# Patient Record
Sex: Male | Born: 1965 | Race: White | Hispanic: No | Marital: Married | Smoking: Former smoker
Health system: Southern US, Community
[De-identification: ages and names within clinical notes are randomized; demographics above are authoritative.]

## PROBLEM LIST (undated history)

## (undated) DIAGNOSIS — T7840XA Allergy, unspecified, initial encounter: Secondary | ICD-10-CM

## (undated) DIAGNOSIS — G4730 Sleep apnea, unspecified: Secondary | ICD-10-CM

## (undated) DIAGNOSIS — M1990 Unspecified osteoarthritis, unspecified site: Secondary | ICD-10-CM

## (undated) DIAGNOSIS — G4733 Obstructive sleep apnea (adult) (pediatric): Principal | ICD-10-CM

## (undated) DIAGNOSIS — F419 Anxiety disorder, unspecified: Secondary | ICD-10-CM

## (undated) DIAGNOSIS — E785 Hyperlipidemia, unspecified: Secondary | ICD-10-CM

## (undated) DIAGNOSIS — F429 Obsessive-compulsive disorder, unspecified: Secondary | ICD-10-CM

## (undated) DIAGNOSIS — R011 Cardiac murmur, unspecified: Secondary | ICD-10-CM

## (undated) DIAGNOSIS — I10 Essential (primary) hypertension: Secondary | ICD-10-CM

## (undated) HISTORY — DX: Allergy, unspecified, initial encounter: T78.40XA

## (undated) HISTORY — DX: Cardiac murmur, unspecified: R01.1

## (undated) HISTORY — DX: Hyperlipidemia, unspecified: E78.5

## (undated) HISTORY — DX: Essential (primary) hypertension: I10

## (undated) HISTORY — DX: Obstructive sleep apnea (adult) (pediatric): G47.33

## (undated) HISTORY — DX: Unspecified osteoarthritis, unspecified site: M19.90

## (undated) HISTORY — DX: Sleep apnea, unspecified: G47.30

## (undated) HISTORY — PX: VASECTOMY: SHX75

## (undated) HISTORY — DX: Obsessive-compulsive disorder, unspecified: F42.9

## (undated) HISTORY — DX: Anxiety disorder, unspecified: F41.9

## (undated) HISTORY — PX: POLYPECTOMY: SHX149

---

## 2000-05-14 ENCOUNTER — Emergency Department (HOSPITAL_COMMUNITY): Admission: EM | Admit: 2000-05-14 | Discharge: 2000-05-14 | Payer: Self-pay | Admitting: Internal Medicine

## 2010-04-11 HISTORY — PX: OTHER SURGICAL HISTORY: SHX169

## 2010-09-10 HISTORY — PX: CARPAL TUNNEL RELEASE: SHX101

## 2011-04-18 ENCOUNTER — Encounter: Payer: Self-pay | Admitting: Internal Medicine

## 2011-06-29 ENCOUNTER — Ambulatory Visit (INDEPENDENT_AMBULATORY_CARE_PROVIDER_SITE_OTHER): Payer: Federal, State, Local not specified - PPO | Admitting: Physician Assistant

## 2011-06-29 VITALS — BP 143/89 | HR 60 | Temp 98.4°F | Resp 18 | Ht 66.0 in | Wt 190.0 lb

## 2011-06-29 DIAGNOSIS — I10 Essential (primary) hypertension: Secondary | ICD-10-CM

## 2011-06-29 DIAGNOSIS — F429 Obsessive-compulsive disorder, unspecified: Secondary | ICD-10-CM

## 2011-06-29 DIAGNOSIS — E785 Hyperlipidemia, unspecified: Secondary | ICD-10-CM

## 2011-06-29 LAB — COMPREHENSIVE METABOLIC PANEL
ALT: 35 U/L (ref 0–35)
Albumin: 4.7 g/dL (ref 3.5–5.2)
Alkaline Phosphatase: 45 U/L (ref 39–117)
Glucose, Bld: 94 mg/dL (ref 70–99)
Potassium: 4.5 mEq/L (ref 3.5–5.3)
Sodium: 140 mEq/L (ref 135–145)
Total Bilirubin: 0.7 mg/dL (ref 0.3–1.2)
Total Protein: 6.9 g/dL (ref 6.0–8.3)

## 2011-06-29 LAB — LIPID PANEL
LDL Cholesterol: 115 mg/dL — ABNORMAL HIGH (ref 0–99)
Total CHOL/HDL Ratio: 4.3 Ratio

## 2011-06-29 MED ORDER — ROSUVASTATIN CALCIUM 20 MG PO TABS: 20.0000 mg | ORAL_TABLET | Freq: Every day | ORAL | Status: DC

## 2011-06-29 MED ORDER — PAROXETINE HCL 30 MG PO TABS: 30.0000 mg | ORAL_TABLET | ORAL | Status: DC

## 2011-06-29 MED ORDER — LISINOPRIL-HYDROCHLOROTHIAZIDE 10-12.5 MG PO TABS: 1.0000 | ORAL_TABLET | Freq: Every day | ORAL | Status: DC

## 2011-06-29 NOTE — Patient Instructions (Signed)
Work on healthy eating and regular exercise. 

## 2011-06-29 NOTE — Progress Notes (Addendum)
  Subjective:    Patient ID: Paul Carson, male    DOB: 05/29/1965, 46 y.o.   MRN: [id number]  HPI Presents for medication refills and re-evaluation of his chronic medical problems:  HTN, hyperlipidemia, and depression.  Feels good on his current regimen.  No complaints.   Review of Systems No chest pain, SOB, HA, dizziness, vision change, N/V, diarrhea, dysuria, myalgias, arthralgias or rash.     Objective:   Physical Exam  Vital signs noted. Well-developed, well nourished WM who is awake, alert and oriented, in NAD. HEENT: [place]/AT, PERRL, EOMI.  Sclera and conjunctiva are clear.  EAC are patent, TMs are normal in appearance. Nasal mucosa is pink and moist. OP is clear. Neck: supple, non-tender, no lymphadenopathey, thyromegaly. Heart: RRR, no murmur Lungs: CTA Abdomen: normo-active bowel sounds, supple, non-tender, no mass or organomegaly. Extremities: no cyanosis, clubbing or edema. Skin: warm and dry without rash.  CMET, Lipids are pending.     Assessment & Plan:   1. Unspecified essential hypertension  lisinopril-hydrochlorothiazide (PRINZIDE,ZESTORETIC) 10-12.5 MG per tablet, Comprehensive metabolic panel  2. Other and unspecified hyperlipidemia  rosuvastatin (CRESTOR) 20 MG tablet, Lipid panel, Comprehensive metabolic panel  3. OCD (obsessive compulsive disorder)  PARoxetine (PAXIL) 30 MG tablet   Schedule CPE with Dr. Perrin Maltese in 6 months.

## 2011-06-30 ENCOUNTER — Encounter: Payer: Self-pay | Admitting: Physician Assistant

## 2011-12-19 ENCOUNTER — Other Ambulatory Visit: Payer: Self-pay | Admitting: Physician Assistant

## 2012-01-02 ENCOUNTER — Encounter: Payer: Self-pay | Admitting: Physician Assistant

## 2012-01-02 ENCOUNTER — Ambulatory Visit (INDEPENDENT_AMBULATORY_CARE_PROVIDER_SITE_OTHER): Payer: Federal, State, Local not specified - PPO | Admitting: Physician Assistant

## 2012-01-02 ENCOUNTER — Encounter: Payer: Federal, State, Local not specified - PPO | Admitting: Internal Medicine

## 2012-01-02 VITALS — BP 126/86 | HR 81 | Temp 98.3°F | Resp 16 | Ht 67.0 in | Wt 186.0 lb

## 2012-01-02 DIAGNOSIS — E785 Hyperlipidemia, unspecified: Secondary | ICD-10-CM

## 2012-01-02 DIAGNOSIS — F429 Obsessive-compulsive disorder, unspecified: Secondary | ICD-10-CM

## 2012-01-02 DIAGNOSIS — I10 Essential (primary) hypertension: Secondary | ICD-10-CM

## 2012-01-02 MED ORDER — PAROXETINE HCL 30 MG PO TABS: 30.0000 mg | ORAL_TABLET | ORAL | Status: DC

## 2012-01-02 MED ORDER — LISINOPRIL-HYDROCHLOROTHIAZIDE 10-12.5 MG PO TABS: 1.0000 | ORAL_TABLET | Freq: Every day | ORAL | Status: DC

## 2012-01-02 MED ORDER — ROSUVASTATIN CALCIUM 20 MG PO TABS: 20.0000 mg | ORAL_TABLET | Freq: Every day | ORAL | Status: DC

## 2012-01-02 NOTE — Progress Notes (Signed)
  Subjective:    Patient ID: Paul Carson, male    DOB: 09-May-1965, 46 y.o.   MRN: [id number]  HPI 46 year old male presents for follow up of hypertension, hyperlipidemia, and depression. He is stable on all medications and denies any adverse effects.  Admits that he has changed to eating [employer]uss most days for lunch. Denies any aerobic exercise.  He has been taking his Crestor daily.  No other complaints today.     Review of Systems  All other systems reviewed and are negative.       Objective:   Physical Exam  Constitutional: He is oriented to person, place, and time. He appears well-developed and well-nourished.  HENT:  Head: Normocephalic and atraumatic.  Right Ear: External ear normal.  Eyes: Conjunctivae normal are normal.  Neck: Normal range of motion.  Cardiovascular: Normal rate, regular rhythm and normal heart sounds.   Pulmonary/Chest: Effort normal and breath sounds normal.  Neurological: He is alert and oriented to person, place, and time.  Psychiatric: He has a normal mood and affect. His behavior is normal. Judgment and thought content normal.          Assessment & Plan:   1. Hyperlipidemia  Lipid panel, rosuvastatin (CRESTOR) 20 MG tablet  2. Hypertension  Comprehensive metabolic panel  3. Unspecified essential hypertension  lisinopril-hydrochlorothiazide (PRINZIDE,ZESTORETIC) 10-12.5 MG per tablet  4. OCD (obsessive compulsive disorder)  PARoxetine (PAXIL) 30 MG tablet   Continue current treatment regimen.  Will await results of lipids.  If still elevated consider adding another medication.  Follow up in 6 months for CPE.

## 2012-01-03 LAB — COMPREHENSIVE METABOLIC PANEL
ALT: 32 U/L (ref 0–53)
AST: 21 U/L (ref 0–37)
Albumin: 4.8 g/dL (ref 3.5–5.2)
Alkaline Phosphatase: 40 U/L (ref 39–117)
BUN: 13 mg/dL (ref 6–23)
CO2: 26 mEq/L (ref 19–32)
Calcium: 9.5 mg/dL (ref 8.4–10.5)
Chloride: 104 mEq/L (ref 96–112)
Creat: 0.96 mg/dL (ref 0.50–1.35)
Glucose, Bld: 93 mg/dL (ref 70–99)
Potassium: 3.7 mEq/L (ref 3.5–5.3)
Sodium: 139 mEq/L (ref 135–145)
Total Bilirubin: 0.5 mg/dL (ref 0.3–1.2)
Total Protein: 7.1 g/dL (ref 6.0–8.3)

## 2012-01-03 LAB — LIPID PANEL
Cholesterol: 185 mg/dL (ref 0–200)
HDL: 42 mg/dL (ref >39)
LDL Cholesterol: 74 mg/dL (ref 0–99)
Total CHOL/HDL Ratio: 4.4 Ratio
Triglycerides: 346 mg/dL — ABNORMAL HIGH (ref <150)
VLDL: 69 mg/dL — ABNORMAL HIGH (ref 0–40)

## 2012-03-27 ENCOUNTER — Ambulatory Visit (INDEPENDENT_AMBULATORY_CARE_PROVIDER_SITE_OTHER): Payer: Federal, State, Local not specified - PPO | Admitting: Internal Medicine

## 2012-03-27 ENCOUNTER — Ambulatory Visit: Payer: Federal, State, Local not specified - PPO

## 2012-03-27 VITALS — BP 140/90 | HR 81 | Temp 98.1°F | Resp 16 | Ht 66.0 in | Wt 187.0 lb

## 2012-03-27 DIAGNOSIS — M25572 Pain in left ankle and joints of left foot: Secondary | ICD-10-CM

## 2012-03-27 DIAGNOSIS — E785 Hyperlipidemia, unspecified: Secondary | ICD-10-CM

## 2012-03-27 DIAGNOSIS — M109 Gout, unspecified: Secondary | ICD-10-CM

## 2012-03-27 DIAGNOSIS — I10 Essential (primary) hypertension: Secondary | ICD-10-CM

## 2012-03-27 DIAGNOSIS — E789 Disorder of lipoprotein metabolism, unspecified: Secondary | ICD-10-CM

## 2012-03-27 DIAGNOSIS — M25579 Pain in unspecified ankle and joints of unspecified foot: Secondary | ICD-10-CM

## 2012-03-27 LAB — COMPREHENSIVE METABOLIC PANEL
Albumin: 4.5 g/dL (ref 3.5–5.2)
Alkaline Phosphatase: 47 U/L (ref 39–117)
BUN: 8 mg/dL (ref 6–23)
CO2: 26 mEq/L (ref 19–32)
Glucose, Bld: 94 mg/dL (ref 70–99)
Total Bilirubin: 0.8 mg/dL (ref 0.3–1.2)

## 2012-03-27 LAB — POCT CBC
HCT, POC: 51.3 % (ref 43.5–53.7)
Lymph, poc: 1.6 (ref 0.6–3.4)
MCH, POC: 29.5 pg (ref 27–31.2)
MCV: 92.2 fL (ref 80–97)
MID (cbc): 0.8 (ref 0–0.9)
POC LYMPH PERCENT: 15.1 %L (ref 10–50)
Platelet Count, POC: 425 10*3/uL — AB (ref 142–424)
RDW, POC: 13.3 %
WBC: 10.8 10*3/uL — AB (ref 4.6–10.2)

## 2012-03-27 LAB — URIC ACID: Uric Acid, Serum: 10.1 mg/dL — ABNORMAL HIGH (ref 4.0–7.8)

## 2012-03-27 LAB — LIPID PANEL
Cholesterol: 165 mg/dL (ref 0–200)
Triglycerides: 149 mg/dL (ref <150)
VLDL: 30 mg/dL (ref 0–40)

## 2012-03-27 MED ORDER — LISINOPRIL 5 MG PO TABS: 5.0000 mg | ORAL_TABLET | Freq: Every day | ORAL | Status: DC

## 2012-03-27 MED ORDER — HYDROCODONE-ACETAMINOPHEN 5-325 MG PO TABS: 1.0000 | ORAL_TABLET | Freq: Four times a day (QID) | ORAL | Status: DC | PRN

## 2012-03-27 MED ORDER — IBUPROFEN 800 MG PO TABS: 800.0000 mg | ORAL_TABLET | Freq: Three times a day (TID) | ORAL | Status: DC | PRN

## 2012-03-27 NOTE — Progress Notes (Signed)
  Subjective:    Patient ID: Paul Carson, male    DOB: [birth year], 46 y.o.   MRN: [id number]  HPI Has ankle pain with no injury for 4-5d. Painful at rest and with passive or active rom. No reddness but some warmth Is on hctz for bp   Review of Systems fhx of gout    Objective:   Physical Exam  Vitals reviewed. Constitutional: He appears well-developed and well-nourished.  Eyes: EOM are normal.  Neck: Neck supple.  Cardiovascular: Normal rate and normal heart sounds.   Pulmonary/Chest: Effort normal.  Musculoskeletal:       Left ankle: He exhibits decreased range of motion and swelling. tenderness. Medial malleolus tenderness found. Achilles tendon exhibits pain. Achilles tendon exhibits normal Thompson's test results.       Feet:   [facility] reading (PRIMARY) by  Dr.Guest has warm very tender medial ankle/NAD seen  Results for orders placed in visit on 03/27/12  POCT CBC      Component Value Range   WBC 10.8 (*) 4.6 - 10.2 K/uL   Lymph, poc 1.6  0.6 - 3.4   POC LYMPH PERCENT 15.1  10 - 50 %L   MID (cbc) 0.8  0 - 0.9   POC MID % 7.4  0 - 12 %M   POC Granulocyte 8.4 (*) 2 - 6.9   Granulocyte percent 77.5  37 - 80 %G   RBC 5.56  4.69 - 6.13 M/uL   Hemoglobin 16.4  14.1 - 18.1 g/dL   HCT, POC 56.2  13.0 - 53.7 %   MCV 92.2  80 - 97 fL   MCH, POC 29.5  27 - 31.2 pg   MCHC 32.0  31.8 - 35.4 g/dL   RDW, POC 86.5     Platelet Count, POC 425 (*) 142 - 424 K/uL   MPV 8.8  0 - 99.8 fL          Assessment & Plan:  DC hctz Start lisinopril 5mg  qd Motrin 800mg  tid pc till pain gone Rest off feet

## 2012-03-27 NOTE — Patient Instructions (Signed)
Gout Gout is an inflammatory condition (arthritis) caused by a buildup of uric acid crystals in the joints. Uric acid is a chemical that is normally present in the blood. Under some circumstances, uric acid can form into crystals in your joints. This causes joint redness, soreness, and swelling (inflammation). Repeat attacks are common. Over time, uric acid crystals can form into masses (tophi) near a joint, causing disfigurement. Gout is treatable and often preventable. CAUSES  The disease begins with elevated levels of uric acid in the blood. Uric acid is produced by your body when it breaks down a naturally found substance called purines. This also happens when you eat certain foods such as meats and fish. Causes of an elevated uric acid level include:  Being passed down from parent to child (heredity).  Diseases that cause increased uric acid production (obesity, psoriasis, some cancers).  Excessive alcohol use.  Diet, especially diets rich in meat and seafood.  Medicines, including certain cancer-fighting drugs (chemotherapy), diuretics, and aspirin.  Chronic kidney disease. The kidneys are no longer able to remove uric acid well.  Problems with metabolism. Conditions strongly associated with gout include:  Obesity.  High blood pressure.  High cholesterol.  Diabetes. Not everyone with elevated uric acid levels gets gout. It is not understood why some people get gout and others do not. Surgery, joint injury, and eating too much of certain foods are some of the factors that can lead to gout. SYMPTOMS   An attack of gout comes on quickly. It causes intense pain with redness, swelling, and warmth in a joint.  Fever can occur.  Often, only one joint is involved. Certain joints are more commonly involved:  Base of the big toe.  Knee.  Ankle.  Wrist.  Finger. Without treatment, an attack usually goes away in a few days to weeks. Between attacks, you usually will not have  symptoms, which is different from many other forms of arthritis. DIAGNOSIS  Your caregiver will suspect gout based on your symptoms and exam. Removal of fluid from the joint (arthrocentesis) is done to check for uric acid crystals. Your caregiver will give you a medicine that numbs the area (local anesthetic) and use a needle to remove joint fluid for exam. Gout is confirmed when uric acid crystals are seen in joint fluid, using a special microscope. Sometimes, blood, urine, and X-ray tests are also used. TREATMENT  There are 2 phases to gout treatment: treating the sudden onset (acute) attack and preventing attacks (prophylaxis). Treatment of an Acute Attack  Medicines are used. These include anti-inflammatory medicines or steroid medicines.  An injection of steroid medicine into the affected joint is sometimes necessary.  The painful joint is rested. Movement can worsen the arthritis.  You may use warm or cold treatments on painful joints, depending which works best for you.  Discuss the use of coffee, vitamin C, or cherries with your caregiver. These may be helpful treatment options. Treatment to Prevent Attacks After the acute attack subsides, your caregiver may advise prophylactic medicine. These medicines either help your kidneys eliminate uric acid from your body or decrease your uric acid production. You may need to stay on these medicines for a very long time. The early phase of treatment with prophylactic medicine can be associated with an increase in acute gout attacks. For this reason, during the first few months of treatment, your caregiver may also advise you to take medicines usually used for acute gout treatment. Be sure you understand your caregiver's directions.   You should also discuss dietary treatment with your caregiver. Certain foods such as meats and fish can increase uric acid levels. Other foods such as dairy can decrease levels. Your caregiver can give you a list of foods  to avoid. HOME CARE INSTRUCTIONS   Do not take aspirin to relieve pain. This raises uric acid levels.  Only take over-the-counter or prescription medicines for pain, discomfort, or fever as directed by your caregiver.  Rest the joint as much as possible. When in bed, keep sheets and blankets off painful areas.  Keep the affected joint raised (elevated).  Use crutches if the painful joint is in your leg.  Drink enough water and fluids to keep your urine clear or pale yellow. This helps your body get rid of uric acid. Do not drink alcoholic beverages. They slow the passage of uric acid.  Follow your caregiver's dietary instructions. Pay careful attention to the amount of protein you eat. Your daily diet should emphasize fruits, vegetables, whole grains, and fat-free or low-fat milk products.  Maintain a healthy body weight. SEEK MEDICAL CARE IF:   You have an oral temperature above 102 F (38.9 C).  You develop diarrhea, vomiting, or any side effects from medicines.  You do not feel better in 24 hours, or you are getting worse. SEEK IMMEDIATE MEDICAL CARE IF:   Your joint becomes suddenly more tender and you have:  Chills.  An oral temperature above 102 F (38.9 C), not controlled by medicine. MAKE SURE YOU:   Understand these instructions.  Will watch your condition.  Will get help right away if you are not doing well or get worse. Document Released: 03/25/2000 Document Revised: 06/20/2011 Document Reviewed: 07/06/2009 [employer] Patient Information 2013 [employer].    

## 2012-03-30 ENCOUNTER — Ambulatory Visit (INDEPENDENT_AMBULATORY_CARE_PROVIDER_SITE_OTHER): Payer: Federal, State, Local not specified - PPO | Admitting: Internal Medicine

## 2012-03-30 VITALS — BP 140/88 | HR 59 | Temp 98.7°F | Resp 18 | Ht 66.0 in | Wt 187.0 lb

## 2012-03-30 DIAGNOSIS — M109 Gout, unspecified: Secondary | ICD-10-CM

## 2012-03-30 NOTE — Progress Notes (Signed)
  Subjective:    Patient ID: Paul Carson, male    DOB: 15-Apr-1965, 46 y.o.   MRN: [id number]  HPI Improved, pain almost gone Uric acid 10.1, quite high,all ;labs reviewed.   Review of Systems     Objective:   Physical Exam No tenderness remains       Assessment & Plan:  Agrees to schedule an appt with me to consider tx uric acid. CPE also

## 2012-04-05 NOTE — Progress Notes (Signed)
Unable to reach by phone, reminder letter sent to pt to schedule CPE with Dr. Perrin Maltese. Paul Carson

## 2012-07-04 ENCOUNTER — Other Ambulatory Visit: Payer: Self-pay | Admitting: Physician Assistant

## 2012-10-19 ENCOUNTER — Ambulatory Visit (INDEPENDENT_AMBULATORY_CARE_PROVIDER_SITE_OTHER): Payer: Federal, State, Local not specified - PPO | Admitting: Physician Assistant

## 2012-10-19 VITALS — BP 124/82 | HR 68 | Temp 97.6°F | Resp 18 | Ht 67.5 in | Wt 191.0 lb

## 2012-10-19 DIAGNOSIS — I10 Essential (primary) hypertension: Secondary | ICD-10-CM

## 2012-10-19 DIAGNOSIS — E785 Hyperlipidemia, unspecified: Secondary | ICD-10-CM

## 2012-10-19 DIAGNOSIS — F429 Obsessive-compulsive disorder, unspecified: Secondary | ICD-10-CM

## 2012-10-19 LAB — BASIC METABOLIC PANEL
CO2: 30 mEq/L (ref 19–32)
Calcium: 9.5 mg/dL (ref 8.4–10.5)
Glucose, Bld: 95 mg/dL (ref 70–99)
Potassium: 4.6 mEq/L (ref 3.5–5.3)
Sodium: 141 mEq/L (ref 135–145)

## 2012-10-19 MED ORDER — ROSUVASTATIN CALCIUM 20 MG PO TABS: 20.0000 mg | ORAL_TABLET | Freq: Every day | ORAL | Status: DC

## 2012-10-19 MED ORDER — LISINOPRIL 5 MG PO TABS: 5.0000 mg | ORAL_TABLET | Freq: Every day | ORAL | Status: DC

## 2012-10-19 MED ORDER — PAROXETINE HCL 30 MG PO TABS: 30.0000 mg | ORAL_TABLET | ORAL | Status: DC

## 2012-10-19 NOTE — Patient Instructions (Addendum)
Continue taking medications as prescribed.  Check blood pressures at home 2-3 times per week - if you see numbers that are consistently >140/90 we may need to increase or change your BP medication.  Plan to schedule a complete physical next door sometime in the next 3 months or so to do routine labs and health screening.  At that time we will also do fasting labs to make sure that the Crestor is at the correct dose.   Health Maintenance, Males A healthy lifestyle and preventative care can promote health and wellness.  Maintain regular health, dental, and eye exams.  Eat a healthy diet. Foods like vegetables, fruits, whole grains, low-fat dairy products, and lean protein foods contain the nutrients you need without too many calories. Decrease your intake of foods high in solid fats, added sugars, and salt. Get information about a proper diet from your caregiver, if necessary.  Regular physical exercise is one of the most important things you can do for your health. Most adults should get at least 150 minutes of moderate-intensity exercise (any activity that increases your heart rate and causes you to sweat) each week. In addition, most adults need muscle-strengthening exercises on 2 or more days a week.   Maintain a healthy weight. The body mass index (BMI) is a screening tool to identify possible weight problems. It provides an estimate of body fat based on height and weight. Your caregiver can help determine your BMI, and can help you achieve or maintain a healthy weight. For adults 20 years and older:  A BMI below 18.5 is considered underweight.  A BMI of 18.5 to 24.9 is normal.  A BMI of 25 to 29.9 is considered overweight.  A BMI of 30 and above is considered obese.  Maintain normal blood lipids and cholesterol by exercising and minimizing your intake of saturated fat. Eat a balanced diet with plenty of fruits and vegetables. Blood tests for lipids and cholesterol should begin at age 60  and be repeated every 5 years. If your lipid or cholesterol levels are high, you are over 50, or you are a high risk for heart disease, you may need your cholesterol levels checked more frequently.Ongoing high lipid and cholesterol levels should be treated with medicines, if diet and exercise are not effective.  If you smoke, find out from your caregiver how to quit. If you do not use tobacco, do not start.  If you choose to drink alcohol, do not exceed 2 drinks per day. One drink is considered to be 12 ounces (355 mL) of beer, 5 ounces (148 mL) of wine, or 1.5 ounces (44 mL) of liquor.  Avoid use of street drugs. Do not share needles with anyone. Ask for help if you need support or instructions about stopping the use of drugs.  High blood pressure causes heart disease and increases the risk of stroke. Blood pressure should be checked at least every 1 to 2 years. Ongoing high blood pressure should be treated with medicines if weight loss and exercise are not effective.  If you are 24 to 46 years old, ask your caregiver if you should take aspirin to prevent heart disease.  Diabetes screening involves taking a blood sample to check your fasting blood sugar level. This should be done once every 3 years, after age 29, if you are within normal weight and without risk factors for diabetes. Testing should be considered at a younger age or be carried out more frequently if you are overweight and  have at least 1 risk factor for diabetes.  Colorectal cancer can be detected and often prevented. Most routine colorectal cancer screening begins at the age of 27 and continues through age 43. However, your caregiver may recommend screening at an earlier age if you have risk factors for colon cancer. On a yearly basis, your caregiver may provide home test kits to check for hidden blood in the stool. Use of a small camera at the end of a tube, to directly examine the colon (sigmoidoscopy or colonoscopy), can detect the  earliest forms of colorectal cancer. Talk to your caregiver about this at age 20, when routine screening begins. Direct examination of the colon should be repeated every 5 to 10 years through age 57, unless early forms of pre-cancerous polyps or small growths are found.  Hepatitis C blood testing is recommended for all people born from [birth year] through 1964 and any individual with known risks for hepatitis C.  Healthy men should no longer receive prostate-specific antigen (PSA) blood tests as part of routine cancer screening. Consult with your caregiver about prostate cancer screening.  Testicular cancer screening is not recommended for adolescents or adult males who have no symptoms. Screening includes self-exam, caregiver exam, and other screening tests. Consult with your caregiver about any symptoms you have or any concerns you have about testicular cancer.  Practice safe sex. Use condoms and avoid high-risk sexual practices to reduce the spread of sexually transmitted infections (STIs).  Use sunscreen with a sun protection factor (SPF) of 30 or greater. Apply sunscreen liberally and repeatedly throughout the day. You should seek shade when your shadow is shorter than you. Protect yourself by wearing long sleeves, pants, a wide-brimmed hat, and sunglasses year round, whenever you are outdoors.  Notify your caregiver of new moles or changes in moles, especially if there is a change in shape or color. Also notify your caregiver if a mole is larger than the size of a pencil eraser.  A one-time screening for abdominal aortic aneurysm (AAA) and surgical repair of large AAAs by sound wave imaging (ultrasonography) is recommended for ages 15 to 59 years who are current or former smokers.  Stay current with your immunizations. Document Released: 09/24/2007 Document Revised: 06/20/2011 Document Reviewed: 08/23/2010 [facility] Patient Information 2014 [place], [place].

## 2012-10-19 NOTE — Progress Notes (Signed)
  Subjective:    Patient ID: Paul Carson, male    DOB: 09-May-1965, 47 y.o.   MRN: [id number]  HPI   [name] is a very pleasant 47 yr old male here for medication refills.    (1)  Paxil - Uses for OCD, has been on for many years, estimates 20-25 yrs.  Feels like symptoms are well controlled.  Denies adverse effects.  (2)  Lisinopril - Was previously taking Lisionpril/HCTZ, but this was thought to have precipitated a gout attack.  He was subsequently changed to plain lisinopril in Dec 2013.  Was taking this regularly, thinks BPs were controlled.  Has run completely out of this.  Took a leftover lisiniopril/hctz this AM.  BP 124/82 today at triage.  (3)  Crestor - States he has been out of this for at least as month.  Knows cholesterol will be elevated.  He is not fasting today.  Last lipid panel normal when pt was on meds.  Thinks last CPE was probably 20-25 yrs ago, knows he should schedule this.  Feels well today.   Review of Systems  All other systems reviewed and are negative.       Objective:   Physical Exam  Vitals reviewed. Constitutional: He is oriented to person, place, and time. He appears well-developed and well-nourished. No distress.  HENT:  Head: Normocephalic and atraumatic.  Eyes: Conjunctivae are normal. No scleral icterus.  Cardiovascular: Normal rate, regular rhythm and normal heart sounds.   Pulmonary/Chest: Effort normal and breath sounds normal. He has no wheezes. He has no rales.  Abdominal: Soft. There is no tenderness.  Neurological: He is alert and oriented to person, place, and time.  Skin: Skin is warm and dry.  Psychiatric: He has a normal mood and affect. His behavior is normal.        Assessment & Plan:  Hypertension - Plan: Basic metabolic panel, lisinopril (PRINIVIL,ZESTRIL) 5 MG tablet  Hyperlipidemia - Plan: rosuvastatin (CRESTOR) 20 MG tablet  Obsessive compulsive disorder - Plan: PARoxetine (PAXIL) 30 MG tablet    Paul Carson  is a very pleasant 47 yr old male here for med refills.  I have refilled lisinopril, crestor, and paxil.  Will check BMP today.  Pt is non-fasting, so I have not done a lipid panel.  Will have pt schedule CPE in the next 3 months, at that time can check fasting labs after pt has been back on crestor.  Tried to impress upon the patient the importance of health maintenance visits, especially since he is on long term medication.  Pt understands and is in agreement with this plan.  States he plans to schedule a CPE at 104.

## 2013-02-14 ENCOUNTER — Other Ambulatory Visit: Payer: Self-pay

## 2013-04-22 ENCOUNTER — Other Ambulatory Visit: Payer: Self-pay | Admitting: Physician Assistant

## 2013-05-24 ENCOUNTER — Encounter: Payer: Self-pay | Admitting: Family Medicine

## 2013-05-24 ENCOUNTER — Ambulatory Visit: Payer: Federal, State, Local not specified - PPO | Admitting: Family Medicine

## 2013-05-24 VITALS — BP 141/96 | HR 75 | Temp 98.1°F | Resp 16 | Ht 66.5 in | Wt 188.0 lb

## 2013-05-24 DIAGNOSIS — I10 Essential (primary) hypertension: Secondary | ICD-10-CM

## 2013-05-24 DIAGNOSIS — Z760 Encounter for issue of repeat prescription: Secondary | ICD-10-CM

## 2013-05-24 MED ORDER — ROSUVASTATIN CALCIUM 20 MG PO TABS: 20.0000 mg | ORAL_TABLET | Freq: Every day | ORAL | Status: DC

## 2013-05-24 MED ORDER — PAROXETINE HCL 30 MG PO TABS: 30.0000 mg | ORAL_TABLET | Freq: Every day | ORAL | Status: DC

## 2013-05-24 MED ORDER — LISINOPRIL 5 MG PO TABS: 5.0000 mg | ORAL_TABLET | Freq: Every day | ORAL | Status: DC

## 2013-05-24 NOTE — Progress Notes (Signed)
S;  This 48 y.o. Cauc male is here for HTN follow-up and medication refills. He was last seen at [facility] in July 2014. Ran out of medication yesterday. He needs a CPE and fasting labs. Wants to loss weight; does not exercise and drinks Pepsi everyday. Denies fatigue, diaphoresis, CP or tightness, palpitations, SOB or DOE, cough, HA, dizziness, numbness or syncope. Pleased w/ statin effect on lipids; no myalgias, abd pain or jaudice.  Patient Active Problem List   Diagnosis Date Noted  . Gout 03/30/2012  . HTN (hypertension) 03/27/2012  . Lipid disorder 03/27/2012   PMHx, Surg Hx, Soc and Fam Hx reviewed.  Medications reconciled.  ROS: As per HPI.  O" Filed Vitals:   05/24/13 1457  BP: 141/96  Pulse: 75  Temp: 98.1 F (36.7 C)  Resp: 16   GEN: In NAD; WN,WD. HENT: EOMI w/ clear conj/sclerae. Otherwise unremarkable. COR: RRR. LUNGS: Normal resp rate and effort. SKIN: W&D; intact w/o diaphoresis, pallor, erythema or jaundice. MS: MAEs; no edema. NEURO: A&O x 3; CNs intact. Nonfocal.  A/P: HTN (hypertension)- Stable; continue current medication. Focus on healthier nutrition; stop drinking Pepsi daily.  Issue of repeat prescriptions  Meds ordered this encounter  Medications  . lisinopril (PRINIVIL,ZESTRIL) 5 MG tablet    Sig: Take 1 tablet (5 mg total) by mouth daily.    Dispense:  30 tablet    Refill:  5  . PARoxetine (PAXIL) 30 MG tablet    Sig: Take 1 tablet (30 mg total) by mouth daily.    Dispense:  30 tablet    Refill:  5  . rosuvastatin (CRESTOR) 20 MG tablet    Sig: Take 1 tablet (20 mg total) by mouth daily.    Dispense:  30 tablet    Refill:  5

## 2013-05-26 ENCOUNTER — Other Ambulatory Visit: Payer: Self-pay | Admitting: Physician Assistant

## 2013-09-24 ENCOUNTER — Ambulatory Visit (INDEPENDENT_AMBULATORY_CARE_PROVIDER_SITE_OTHER): Payer: Federal, State, Local not specified - PPO | Admitting: Family Medicine

## 2013-09-24 ENCOUNTER — Encounter: Payer: Self-pay | Admitting: Family Medicine

## 2013-09-24 VITALS — BP 141/87 | HR 61 | Temp 98.3°F | Resp 16 | Ht 66.0 in | Wt 186.0 lb

## 2013-09-24 DIAGNOSIS — Z0000 Encounter for general adult medical examination without abnormal findings: Secondary | ICD-10-CM

## 2013-09-24 DIAGNOSIS — E789 Disorder of lipoprotein metabolism, unspecified: Secondary | ICD-10-CM

## 2013-09-24 DIAGNOSIS — I10 Essential (primary) hypertension: Secondary | ICD-10-CM

## 2013-09-24 LAB — LIPID PANEL
CHOLESTEROL: 148 mg/dL (ref 0–200)
HDL: 48 mg/dL (ref >39)
LDL CALC: 76 mg/dL (ref 0–99)
Total CHOL/HDL Ratio: 3.1 Ratio
Triglycerides: 121 mg/dL (ref <150)
VLDL: 24 mg/dL (ref 0–40)

## 2013-09-24 LAB — COMPLETE METABOLIC PANEL WITH GFR
ALT: 28 U/L (ref 0–53)
AST: 23 U/L (ref 0–37)
Albumin: 4.3 g/dL (ref 3.5–5.2)
Alkaline Phosphatase: 44 U/L (ref 39–117)
BUN: 12 mg/dL (ref 6–23)
CALCIUM: 9.3 mg/dL (ref 8.4–10.5)
CO2: 27 meq/L (ref 19–32)
CREATININE: 0.93 mg/dL (ref 0.50–1.35)
Chloride: 104 mEq/L (ref 96–112)
GFR, Est Non African American: >89 mL/min
Glucose, Bld: 91 mg/dL (ref 70–99)
POTASSIUM: 4.4 meq/L (ref 3.5–5.3)
Sodium: 139 mEq/L (ref 135–145)
Total Bilirubin: 0.9 mg/dL (ref 0.2–1.2)
Total Protein: 6.9 g/dL (ref 6.0–8.3)

## 2013-09-24 MED ORDER — PAROXETINE HCL 30 MG PO TABS: 30.0000 mg | ORAL_TABLET | Freq: Every day | ORAL | Status: DC

## 2013-09-24 MED ORDER — LISINOPRIL 5 MG PO TABS: 5.0000 mg | ORAL_TABLET | Freq: Every day | ORAL | Status: DC

## 2013-09-24 NOTE — Progress Notes (Signed)
Subjective:    Patient ID: Paul Carson, male    DOB: 19-Aug-1965, 48 y.o.   MRN: [id number]  HPI  This 48 y.o. Cauc male has HTN and long-standing lipid disorder. He is compliant w/ medications, checking his BP at home occasionally (readings= 130-140/70-85). Pt reports no adverse effects but has periodic hand and foot itching in the evenings. He wonders if this is related to statin medication; onset of pruritis around the time he started statin.  Patient Active Problem List   Diagnosis Date Noted  . Gout 03/30/2012  . HTN (hypertension) 03/27/2012  . Lipid disorder 03/27/2012   Prior to Admission medications   Medication Sig Start Date End Date Taking? Authorizing Provider  ibuprofen (ADVIL,MOTRIN) 800 MG tablet Take 1 tablet (800 mg total) by mouth every 8 (eight) hours as needed for pain. 03/27/12  Yes Orma Flaming, MD  lisinopril (PRINIVIL,ZESTRIL) 5 MG tablet Take 1 tablet (5 mg total) by mouth daily. 05/24/13  Yes Barton Fanny, MD  PARoxetine (PAXIL) 30 MG tablet Take 1 tablet (30 mg total) by mouth daily. 05/24/13  Yes Barton Fanny, MD  rosuvastatin (CRESTOR) 20 MG tablet Take 1 tablet (20 mg total) by mouth daily. 05/24/13  Yes Barton Fanny, MD   PMHx, Surg Hx, Soc and Fam Hx reviewed.   Review of Systems  Constitutional: Negative.   HENT: Negative.   Eyes:       Wears corrective lenses; periodic exams at Dr. Coral Else facility.  Respiratory: Negative.   Cardiovascular: Negative.   Gastrointestinal: Negative.   Endocrine: Negative.   Genitourinary: Negative.   Musculoskeletal: Negative.        R supraclavicular swelling; painless. No hx of trauma or associated symptoms (cough, SOB, substernal CP).  Skin: Positive for rash.       Rash on top of R foot; improving w/ topical OTC medication.  Allergic/Immunologic: Negative.   Neurological: Negative.   Hematological: Negative.   Psychiatric/Behavioral: Negative.       Objective:   Physical Exam    Nursing note and vitals reviewed. Constitutional: He is oriented to person, place, and time. Vital signs are normal. He appears well-developed and well-nourished. No distress.  HENT:  Head: Normocephalic and atraumatic.  Right Ear: Hearing, tympanic membrane, external ear and ear canal normal.  Left Ear: Hearing, tympanic membrane, external ear and ear canal normal.  Nose: Nose normal. No nasal deformity or septal deviation.  Mouth/Throat: Uvula is midline, oropharynx is clear and moist and mucous membranes are normal. No oral lesions. Normal dentition. No dental caries.  Eyes: Conjunctivae, EOM and lids are normal. No scleral icterus.  Fundoscopic exam:      The right eye shows no papilledema. The right eye shows red reflex.       The left eye shows no papilledema. The left eye shows red reflex.  Neck: Normal range of motion and full passive range of motion without pain. Neck supple. No JVD present. No spinous process tenderness and no muscular tenderness present. No mass and no thyromegaly present.  Cardiovascular: Normal rate, regular rhythm, S2 normal, normal heart sounds and normal pulses.   No extrasystoles are present. PMI is not displaced.  Exam reveals no gallop and no friction rub.   No murmur heard. Pulses:      Radial pulses are 2+ on the right side, and 2+ on the left side.       Femoral pulses are 2+ on the right side, and 2+  on the left side.      Popliteal pulses are 2+ on the right side, and 2+ on the left side.       Dorsalis pedis pulses are 2+ on the right side, and 2+ on the left side.       Posterior tibial pulses are 2+ on the right side, and 2+ on the left side.  Pulmonary/Chest: Effort normal and breath sounds normal. No respiratory distress.  Abdominal: Soft. Normal appearance and bowel sounds are normal. He exhibits no distension, no pulsatile midline mass and no mass. There is no hepatosplenomegaly. There is no tenderness. There is no guarding and no CVA  tenderness. No hernia.  Genitourinary:  Deferred.  Musculoskeletal:       Right shoulder: Normal.       Left shoulder: Normal.       Right wrist: Normal.       Left wrist: Normal.       Right knee: Normal.       Left knee: Normal.       Cervical back: Normal.       Thoracic back: Normal.       Lumbar back: Normal.       Right foot: Normal.       Left foot: Normal.  R supraclavicular space- NT soft mass/ swelling that increase with valsalva and rotation of neck to left.  Lymphadenopathy:       Head (right side): No submental, no submandibular, no tonsillar, no posterior auricular and no occipital adenopathy present.       Head (left side): No submental, no submandibular, no tonsillar, no posterior auricular and no occipital adenopathy present.    He has no cervical adenopathy.       Right: No inguinal and no supraclavicular adenopathy present.       Left: No inguinal and no supraclavicular adenopathy present.  Neurological: He is alert and oriented to person, place, and time. He has normal strength and normal reflexes. He displays no atrophy and no tremor. No cranial nerve deficit or sensory deficit. He exhibits normal muscle tone. He displays a negative Romberg sign. Coordination and gait normal.  Skin: Skin is warm, dry and intact. Rash noted. No ecchymosis noted. He is not diaphoretic. No cyanosis or erythema. No pallor. Nails show no clubbing.  R foot: dorsum- erythematous ring-like lesion with scaliness.  Psychiatric: He has a normal mood and affect. His speech is normal and behavior is normal. Judgment and thought content normal. Cognition and memory are normal.    ECG: NSR; no ST-TW changes. No ectopy.     Assessment & Plan:  Routine general medical examination at a health care facility - Plan: EKG 12-Lead, COMPLETE METABOLIC PANEL WITH GFR, Thyroid Panel With TSH  Lipid disorder - Plan: Lipid panel, COMPLETE METABOLIC PANEL WITH GFR, Thyroid Panel With TSH, Vitamin D,  25-hydroxy  HTN (hypertension) - Plan: Lipid panel, COMPLETE METABOLIC PANEL WITH GFR, Thyroid Panel With TSH, Vitamin D, 25-hydroxy  Meds ordered this encounter  Medications  . lisinopril (PRINIVIL,ZESTRIL) 5 MG tablet    Sig: Take 1 tablet (5 mg total) by mouth daily.    Dispense:  30 tablet    Refill:  11  . PARoxetine (PAXIL) 30 MG tablet    Sig: Take 1 tablet (30 mg total) by mouth daily.    Dispense:  30 tablet    Refill:  11   Pt will hold Crestor for 1-2 weeks to see if itching sensation  in hands and feet resolves. If it does, he will contact the clinic. If not, he will resume medication.

## 2013-09-24 NOTE — Patient Instructions (Addendum)
Keeping you healthy  Get these tests  Blood pressure- Have your blood pressure checked once a year by your healthcare provider.  Normal blood pressure is 120/80.  Weight- Have your body mass index (BMI) calculated to screen for obesity.  BMI is a measure of body fat based on height and weight. You can also calculate your own BMI at [employer]velBags.it.  Cholesterol- Have your cholesterol checked regularly starting at age 47, sooner may be necessary if you have diabetes, high blood pressure, if a family member developed heart diseases at an early age or if you smoke.   Chlamydia, HIV, and other sexual transmitted disease- Get screened each year until the age of 43 then within three months of each new sexual partner.  Diabetes- Have your blood sugar checked regularly if you have high blood pressure, high cholesterol, a family history of diabetes or if you are overweight.  Get these vaccines  Flu shot- Every fall.  Tetanus shot- Every 10 years. One-time Tdap given in 2010; next Tetanus due in 2020.  Menactra- Single dose; prevents meningitis.  Take these steps  Don't smoke- If you do smoke, ask your healthcare provider about quitting. For tips on how to quit, go to www.smokefree.gov or call 1-800-QUIT-NOW.  Be physically active- Exercise 5 days a week for at least 30 minutes.  If you are not already physically active start slow and gradually work up to 30 minutes of moderate physical activity.  Examples of moderate activity include walking briskly, mowing the yard, dancing, swimming bicycling, etc.  Eat a healthy diet- Eat a variety of healthy foods such as fruits, vegetables, low fat milk, low fat cheese, yogurt, lean meats, poultry, fish, beans, tofu, etc.  For more information on healthy eating, go to www.thenutritionsource.org  Drink alcohol in moderation- Limit alcohol intake two drinks or less a day.  Never drink and drive.  Dentist- Brush and floss teeth twice daily; visit  your dentis twice a year.  Depression-Your emotional health is as important as your physical health.  If you're feeling down, losing interest in things you normally enjoy please talk with your healthcare provider.  Gun Safety- If you keep a gun in your home, keep it unloaded and with the safety lock on.  Bullets should be stored separately.  Helmet use- Always wear a helmet when riding a motorcycle, bicycle, rollerblading or skateboarding.  Safe sex- If you may be exposed to a sexually transmitted infection, use a condom  Seat belts- Seat bels can save your life; always wear one.  Smoke/Carbon Monoxide detectors- These detectors need to be installed on the appropriate level of your home.  Replace batteries at least once a year.  Skin Cancer- When out in the sun, cover up and use sunscreen SPF 15 or higher.  Violence- If anyone is threatening or hurting you, please tell your healthcare provider.      Heart Disease Prevention Heart disease can lead to heart attacks and strokes. This is a leading cause of death. Heart disease can be inherited and can be caused from the lifestyle you lead. You can do a lot to keep your heart and blood vessels healthy.  WHAT SHOULD I DO EACH DAY TO KEEP MY HEART HEALTHY?  Do not smoke.  Follow a healthy eating plan as recommended by your caregiver or dietitian.  Be active for a total of 30 minutes most days. Ask your caregiver what activities are best for you.  Limit the amount of alcohol you drink.  Involve  family and friends to help you with a healthy lifestyle. HOW DOES HEART DISEASE CAUSE HIGH BLOOD PRESSURE?  Narrowed blood vessels leave a smaller opening for blood to flow through. It is like turning on a garden hose and holding your thumb over the opening. The smaller opening makes the water shoot out with more pressure. In the same way, narrowed blood vessels can lead to high blood pressure. Other factors, such as kidney problems and being  overweight, also can lead to high blood pressure.  If you have high blood pressure you may need to take blood pressure medicine every day. Some types of blood pressure medicine can also help keep your kidneys healthy.  Many people with diabetes also have high blood pressure. If you have heart, eye, or kidney problems from diabetes, high blood pressure can make them worse. HOW DO MY BLOOD VESSELS GET CLOGGED?  Cholesterol is a substance that is made by the body and used for many important functions. It is also found in food that comes from animals. When your cholesterol is high, it can stick to the insides of your blood vessels, making them narrowed and even clogged. This problem is called atherosclerosis.  Narrowed and clogged blood vessels make it harder for blood to get to important body organs. This can cause problems such as:  Chest pain (angina). Angina can cause temporary pain in your chest, arms, shoulders, or back. You may feel the pain more when your heart beats faster, such as when you exercise. The pain may go away when you rest. You also may feel very weak and sweaty.  A heart attack. A heart attack happens when a blood vessel in or near the heart becomes blocked. Not enough blood is getting to the heart. During a heart attack, you may have chest pain in your chest, arms, shoulders, or back along with nausea, indigestion, extreme weakness, and sweating. WHAT CAN I DO TO PREVENT HEART DISEASE?   Keep your blood pressure under control as recommended by your caregiver.  Keep your cholesterol under control. Have it checked at least once a year. Target cholesterol levels for most people are:  Total blood cholesterol level: Below 200.  LDL (bad) cholesterol: Below 100.  HDL (good) cholesterol: Above 40 in men and above 50 in women.  Triglycerides (another type of fat in the blood): Below 150.  Make physical activity a part of your daily routine. Check with your caregiver to learn  what activities are best for you.  Make sure that the foods you eat are "heart-healthy."  Include foods high in fiber, such as oat bran, oatmeal, whole-grain breads and cereals.  Cut back on fried foods and foods high in saturated fat. This includes foods such as meats, butter, whole dairy products, shortening, and coconut or palm oil.  Avoid salty foods such as canned food, luncheon meat, salty snacks, and fast food.  Eat more fruits and vegetables.  Drink less alcohol.  Lose weight as recommended by your caregiver.  If you smoke, quit. Your caregiver can help you with quitting options.  Ask your caregiver whether you should take a daily aspirin. Studies have shown that taking aspirin can help reduce your risk of heart disease and stroke.  Take your prescribed medicines as directed. WHAT ARE THE WARNING SIGNS OF A HEART ATTACK? You may have one or more of the following warning signs:  Chest pain or discomfort.  Pain or discomfort in your arms, back, jaw, or neck.  Indigestion or  stomach pain.  Shortness of breath.  Sweating.  Nausea or vomiting.  Lightheadedness.  No warning signs at all or they may come and go. FOR MORE INFORMATION  To find out more about heart disease and stroke prevention, visit the American Heart Association website at www.americanheart.org Document Released: 11/10/2003 Document Revised: 09/27/2011 Document Reviewed: 05/25/2007 Exit[employer] Patient Information 2014 Exit[employer], [place].    LIPID MEDICATION-  To determine if the itching of hands and feet is related to Crestor, you could hold the medication for 1-2 weeks and see if the itching resides. If the itching does stop, you need to let me know.  If the itching continues, restart the medication; you rpharmacy can contact us when you need additional refills.

## 2013-09-25 LAB — THYROID PANEL WITH TSH
FREE THYROXINE INDEX: 2.9 (ref 1.0–3.9)
T3 UPTAKE: 38.6 % — AB (ref 22.5–37.0)
T4, Total: 7.6 ug/dL (ref 5.0–12.5)
TSH: 1.186 u[IU]/mL (ref 0.350–4.500)

## 2013-09-25 LAB — VITAMIN D 25 HYDROXY (VIT D DEFICIENCY, FRACTURES): Vit D, 25-Hydroxy: 46 ng/mL (ref 30–89)

## 2013-12-23 ENCOUNTER — Other Ambulatory Visit: Payer: Self-pay | Admitting: Family Medicine

## 2013-12-26 ENCOUNTER — Other Ambulatory Visit: Payer: Self-pay | Admitting: Family Medicine

## 2014-03-25 ENCOUNTER — Encounter: Payer: Self-pay | Admitting: Family Medicine

## 2014-03-25 ENCOUNTER — Ambulatory Visit (INDEPENDENT_AMBULATORY_CARE_PROVIDER_SITE_OTHER): Payer: Federal, State, Local not specified - PPO | Admitting: Family Medicine

## 2014-03-25 VITALS — BP 130/80 | HR 66 | Temp 98.4°F | Resp 16 | Ht 66.5 in | Wt 181.8 lb

## 2014-03-25 DIAGNOSIS — I10 Essential (primary) hypertension: Secondary | ICD-10-CM

## 2014-03-25 DIAGNOSIS — Z23 Encounter for immunization: Secondary | ICD-10-CM

## 2014-03-25 DIAGNOSIS — E789 Disorder of lipoprotein metabolism, unspecified: Secondary | ICD-10-CM

## 2014-03-25 MED ORDER — ROSUVASTATIN CALCIUM 20 MG PO TABS: ORAL_TABLET | ORAL | Status: DC

## 2014-03-25 NOTE — Progress Notes (Signed)
S;  This 48 y.o. 89 male is here for follow-up; he is compliant w/ medications for HTN and lipid disorder. His wife, who is a Marine scientist, checks BP at home (readings ~130/75-80). Hx of gout but no flare in > 1 year due to dietary restrictions. Pt denies diaphoresis, fatigue, vision disturbances, CP or tightness, palpitations, SOB or cough, edema, HA, dizziness, weakness, numbness or syncope. No reports of myalgias associated w/ statin. Pt working on weight reduction; has reduced Pepsi intake and increased water. Does drink tea w/ lunch daily.  Patient Active Problem List   Diagnosis Date Noted  . Gout 03/30/2012  . HTN (hypertension) 03/27/2012  . Lipid disorder 03/27/2012    Prior to Admission medications   Medication Sig Start Date End Date Taking? Authorizing Provider  lisinopril (PRINIVIL,ZESTRIL) 5 MG tablet Take 1 tablet (5 mg total) by mouth daily. 09/24/13  Yes Barton Fanny, MD  PARoxetine (PAXIL) 30 MG tablet Take 1 tablet (30 mg total) by mouth daily. 09/24/13  Yes Barton Fanny, MD  rosuvastatin (CRESTOR) 20 MG tablet TAKE 1 TABLET (20 MG TOTAL) BY MOUTH DAILY.   Yes Barton Fanny, MD  ibuprofen (ADVIL,MOTRIN) 800 MG tablet Take 1 tablet (800 mg total) by mouth every 8 (eight) hours as needed for pain. Patient not taking: Reported on 03/25/2014 03/27/12   Orma Flaming, MD    [facility] and [facility] Hx reviewed.  ROS: As per HPI. No change in psych status- no agitation, concentration difficulties, sleep disturbance or thoughts of self-harm.  O:  Filed Vitals:   03/25/14 1342  BP: 130/80  Pulse: 66  Temp: 98.4 F (36.9 C)  Resp: 16    GEN: In NAD; WN,WD. Weight down ~ 6 lbs since Feb 2015. HENT: [place]/AT; EOMI w/ clear conj/sclerae. Otherwise unremarkable. COR: RRR. LUNGS: Normal resp rate and effort. SKIN; W&D; intact w/o diaphoresis, erythema or pallor. NEURO: A&O x 3; CNs intact. Nonfocal.  A/P: Essential hypertension- Stable on current dose of lisinopril 5 mg  daily; continue weight loss efforts.  Lipid disorder- Stable on rosuvastatin w/o adverse reaction. No change.  Flu vaccine need - Plan: Flu Vaccine QUAD 36+ mos IM   RTC in 6 months for CPE/ fasting labs.

## 2014-08-02 ENCOUNTER — Other Ambulatory Visit: Payer: Self-pay | Admitting: Physician Assistant

## 2014-09-01 ENCOUNTER — Other Ambulatory Visit: Payer: Self-pay | Admitting: Physician Assistant

## 2014-10-01 ENCOUNTER — Encounter: Payer: Federal, State, Local not specified - PPO | Admitting: Family Medicine

## 2014-10-02 ENCOUNTER — Other Ambulatory Visit: Payer: Self-pay | Admitting: Family Medicine

## 2014-10-06 ENCOUNTER — Ambulatory Visit (INDEPENDENT_AMBULATORY_CARE_PROVIDER_SITE_OTHER): Payer: Federal, State, Local not specified - PPO | Admitting: Family Medicine

## 2014-10-06 ENCOUNTER — Encounter: Payer: Self-pay | Admitting: Family Medicine

## 2014-10-06 VITALS — BP 128/86 | HR 66 | Temp 98.6°F | Resp 16 | Ht 64.5 in | Wt 184.4 lb

## 2014-10-06 DIAGNOSIS — I10 Essential (primary) hypertension: Secondary | ICD-10-CM | POA: Diagnosis not present

## 2014-10-06 DIAGNOSIS — Z139 Encounter for screening, unspecified: Secondary | ICD-10-CM | POA: Diagnosis not present

## 2014-10-06 DIAGNOSIS — M5442 Lumbago with sciatica, left side: Secondary | ICD-10-CM

## 2014-10-06 DIAGNOSIS — Z1389 Encounter for screening for other disorder: Secondary | ICD-10-CM

## 2014-10-06 DIAGNOSIS — E669 Obesity, unspecified: Secondary | ICD-10-CM | POA: Diagnosis not present

## 2014-10-06 DIAGNOSIS — E789 Disorder of lipoprotein metabolism, unspecified: Secondary | ICD-10-CM | POA: Diagnosis not present

## 2014-10-06 DIAGNOSIS — Z0000 Encounter for general adult medical examination without abnormal findings: Secondary | ICD-10-CM | POA: Diagnosis not present

## 2014-10-06 LAB — CBC
HEMATOCRIT: 46.9 % (ref 39.0–52.0)
HEMOGLOBIN: 16.6 g/dL (ref 13.0–17.0)
MCH: 30.5 pg (ref 26.0–34.0)
MCHC: 35.4 g/dL (ref 30.0–36.0)
MCV: 86.2 fL (ref 78.0–100.0)
MPV: 9 fL (ref 8.6–12.4)
Platelets: 299 10*3/uL (ref 150–400)
RBC: 5.44 MIL/uL (ref 4.22–5.81)
RDW: 13.4 % (ref 11.5–15.5)
WBC: 5.9 10*3/uL (ref 4.0–10.5)

## 2014-10-06 LAB — POCT URINALYSIS DIPSTICK
BILIRUBIN UA: NEGATIVE
Glucose, UA: NEGATIVE
Ketones, UA: NEGATIVE
LEUKOCYTES UA: NEGATIVE
Nitrite, UA: NEGATIVE
Protein, UA: NEGATIVE
RBC UA: NEGATIVE
Spec Grav, UA: >=1.03
Urobilinogen, UA: 0.2
pH, UA: 5

## 2014-10-06 LAB — COMPREHENSIVE METABOLIC PANEL
ALBUMIN: 4.2 g/dL (ref 3.5–5.2)
ALT: 49 U/L (ref 0–53)
AST: 31 U/L (ref 0–37)
Alkaline Phosphatase: 43 U/L (ref 39–117)
BUN: 14 mg/dL (ref 6–23)
CO2: 25 mEq/L (ref 19–32)
Calcium: 9.5 mg/dL (ref 8.4–10.5)
Chloride: 103 mEq/L (ref 96–112)
Creat: 1.02 mg/dL (ref 0.50–1.35)
Glucose, Bld: 96 mg/dL (ref 70–99)
POTASSIUM: 4.6 meq/L (ref 3.5–5.3)
Sodium: 140 mEq/L (ref 135–145)
Total Bilirubin: 0.8 mg/dL (ref 0.2–1.2)
Total Protein: 7.1 g/dL (ref 6.0–8.3)

## 2014-10-06 LAB — LIPID PANEL
CHOLESTEROL: 176 mg/dL (ref 0–200)
HDL: 43 mg/dL (ref >=40)
LDL Cholesterol: 84 mg/dL (ref 0–99)
Total CHOL/HDL Ratio: 4.1 Ratio
Triglycerides: 243 mg/dL — ABNORMAL HIGH (ref <150)
VLDL: 49 mg/dL — ABNORMAL HIGH (ref 0–40)

## 2014-10-06 NOTE — Progress Notes (Signed)
Subjective:    Patient ID: Paul Carson, male    DOB: 07-31-1965, 49 y.o.   MRN: [id number]  HPI This is a very pleasant 49 yo male who presents today for CPE.   Last CPE- 2015 Tdap- 04/20/2008 Flu- annual Dental- not in years Eye- regular Exercise- no regular exercise.  Has noticed more low back pain in last 2-3 years. 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 He has aching across his low back most days. He does not take anything for pain. He has occasional shooting pains down his left leg 1-2x per month. These last for a couple of seconds. No weakness in his arms or legs. No known recent or distant injury. No falls.   Past Medical History  Diagnosis Date  . Hypertension   . Hyperlipidemia   . Anxiety   . OCD (obsessive compulsive disorder)   . Allergy     seasonal   Past Surgical History  Procedure Laterality Date  . Carpal tunnel release  09/2010    left wrist  . Vasectomy    . Carpal tunel  2012    left wrist    Family History  Problem Relation Age of Onset  . Hypertension Father    History  Substance Use Topics  . Smoking status: Former Smoker -- 10 years    Types: Cigarettes    Quit date: 06/28/2001  . Smokeless tobacco: Never Used  . Alcohol Use: Not on file   Review of Systems  Constitutional: Negative.   HENT: Negative.   Eyes: Negative.   Respiratory: Negative.   Cardiovascular: Negative.   Gastrointestinal: Negative.   Endocrine: Negative.   Genitourinary: Negative.   Musculoskeletal: Positive for back pain.  Skin: Negative.   Allergic/Immunologic: Negative.   Neurological: Negative.   Hematological: Negative.   Psychiatric/Behavioral: Negative.       Objective:   Physical Exam  Constitutional: He is oriented to person, place, and time. He appears well-developed and well-nourished.  HENT:  Head: Normocephalic and atraumatic.  Right Ear: External ear normal.  Left Ear: External ear normal.  Nose: Nose normal.    Mouth/Throat: Oropharynx is clear and moist.  Eyes: Conjunctivae are normal. Pupils are equal, round, and reactive to light.  Neck: Normal range of motion. Neck supple.  Cardiovascular: Normal rate, regular rhythm, normal heart sounds and intact distal pulses.   Pulmonary/Chest: Effort normal and breath sounds normal.  Abdominal: Soft. Bowel sounds are normal. Hernia confirmed negative in the right inguinal area and confirmed negative in the left inguinal area.  Genitourinary: Testes normal and penis normal. Circumcised.  Musculoskeletal: Normal range of motion. He exhibits no edema or tenderness.       Cervical back: Normal.       Thoracic back: Normal.       Lumbar back: Normal.  Lymphadenopathy:    He has no cervical adenopathy.       Right: No inguinal adenopathy present.       Left: No inguinal adenopathy present.  Neurological: He is alert and oriented to person, place, and time. He has normal reflexes.  Skin: Skin is warm and dry.  Psychiatric: He has a normal mood and affect. His behavior is normal. Judgment normal.  Vitals reviewed.  BP 128/86 mmHg  Pulse 66  Temp(Src) 88.6 F (31.4 C) (Oral)  Resp 16  Ht 5' 4.5" (1.638 m)  Wt 184 lb 6.4 oz (83.643 kg)  BMI 31.17 kg/m2  SpO2 97% Wt Readings from Last 3 Encounters:  10/06/14 184 lb 6.4 oz (83.643 kg)  03/25/14 181 lb 12.8 oz (82.464 kg)  09/24/13 186 lb (84.369 kg)      Assessment & Plan:  1. Annual physical exam  2. Essential hypertension - Blood pressure 128/86, continue lisinopril 5 mg.  - CBC - Comprehensive metabolic panel - Lipid panel  3. Lipid disorder - CBC - Comprehensive metabolic panel - Lipid panel  4. Screening for hematuria or proteinuria - POCT urinalysis dipstick  5. Obesity - Encouraged improved food choices- discussed eliminating sodas/sweet tea, white potatoes, rice, pasta, fried foods and fast foods.  - Encouraged him to try to walk for exercise at least every other day. Suggested a  pedometer counter app on his smart phone and to aim for 5,000-10,000 steps daily. - Comprehensive metabolic panel - Lipid panel  6. Bilateral low back pain with left-sided sciatica - Provided Back Manual and encouraged weight loss, regular stretching, core strengthening and acetaminophen as needed. - RTC if no improvement or if worsening symptoms or more frequent sciatic symptoms.  - Follow up in 6 months  Clarene Reamer, FNP-BC  [facility]  10/06/2014 9:46 PM

## 2014-10-06 NOTE — Patient Instructions (Signed)
Try to walk 5,000-10,000 steps most days- can try Steps app on smart phone Work on gradual weight loss 2-4 pounds per month If back pain gets worse, please come back in  Can try tylenol 2-3 times a day and topical ointment like Salon pas     Why follow it? Research shows. . Those who follow the Mediterranean diet have a reduced risk of heart disease  . The diet is associated with a reduced incidence of Parkinson's and Alzheimer's diseases . People following the diet may have longer life expectancies and lower rates of chronic diseases  . The Dietary Guidelines for Americans recommends the Mediterranean diet as an eating plan to promote health and prevent disease  What Is the Mediterranean Diet?  . Healthy eating plan based on typical foods and recipes of Mediterranean-style cooking . The diet is primarily a plant based diet; these foods should make up a majority of meals   Starches - Plant based foods should make up a majority of meals - They are an important sources of vitamins, minerals, energy, antioxidants, and fiber - Choose whole grains, foods high in fiber and minimally processed items  - Typical grain sources include wheat, oats, barley, corn, brown rice, bulgar, farro, millet, polenta, couscous  - Various types of beans include chickpeas, lentils, fava beans, black beans, white beans   Fruits  Veggies - Large quantities of antioxidant rich fruits & veggies; 6 or more servings  - Vegetables can be eaten raw or lightly drizzled with oil and cooked  - Vegetables common to the traditional Mediterranean Diet include: artichokes, arugula, beets, broccoli, brussel sprouts, cabbage, carrots, celery, collard greens, cucumbers, eggplant, kale, leeks, lemons, lettuce, mushrooms, okra, onions, peas, peppers, potatoes, pumpkin, radishes, rutabaga, shallots, spinach, sweet potatoes, turnips, zucchini - Fruits common to the Mediterranean Diet include: apples, apricots, avocados, cherries,  clementines, dates, figs, grapefruits, grapes, melons, nectarines, oranges, peaches, pears, pomegranates, strawberries, tangerines  Fats - Replace butter and margarine with healthy oils, such as olive oil, canola oil, and tahini  - Limit nuts to no more than a handful a day  - Nuts include walnuts, almonds, pecans, pistachios, pine nuts  - Limit or avoid candied, honey roasted or heavily salted nuts - Olives are central to the [employer] - can be eaten whole or used in a variety of dishes   Meats Protein - Limiting red meat: no more than a few times a month - When eating red meat: choose lean cuts and keep the portion to the size of deck of cards - Eggs: approx. 0 to 4 times a week  - Fish and lean poultry: at least 2 a week  - Healthy protein sources include, chicken, [place], lean beef, lamb - Increase intake of seafood such as tuna, salmon, trout, mackerel, shrimp, scallops - Avoid or limit high fat processed meats such as sausage and bacon  Dairy - Include moderate amounts of low fat dairy products  - Focus on healthy dairy such as fat free yogurt, skim milk, low or reduced fat cheese - Limit dairy products higher in fat such as whole or 2% milk, cheese, ice cream  Alcohol - Moderate amounts of red wine is ok  - No more than 5 oz daily for women (all ages) and men older than age 33  - No more than 10 oz of wine daily for men younger than 74  Other - Limit sweets and other desserts  - Use herbs and spices instead of salt to  flavor foods  - Herbs and spices common to the traditional Mediterranean Diet include: basil, bay leaves, chives, cloves, cumin, fennel, garlic, lavender, marjoram, mint, oregano, parsley, pepper, rosemary, sage, savory, sumac, tarragon, thyme   It's not just a diet, it's a lifestyle:  . The Mediterranean diet includes lifestyle factors typical of those in the region  . Foods, drinks and meals are best eaten with others and savored . Daily physical activity is  important for overall good health . This could be strenuous exercise like running and aerobics . This could also be more leisurely activities such as walking, housework, yard-work, or taking the stairs . Moderation is the key; a balanced and healthy diet accommodates most foods and drinks . Consider portion sizes and frequency of consumption of certain foods   Meal Ideas & Options:  . Breakfast:  o Whole wheat toast or whole wheat English muffins with peanut butter & hard boiled egg o Steel cut oats topped with apples & cinnamon and skim milk  o Fresh fruit: banana, strawberries, melon, berries, peaches  o Smoothies: strawberries, bananas, greek yogurt, peanut butter o Low fat greek yogurt with blueberries and granola  o Egg white omelet with spinach and mushrooms o Breakfast couscous: whole wheat couscous, apricots, skim milk, cranberries  . Sandwiches:  o Hummus and grilled vegetables (peppers, zucchini, squash) on whole wheat bread   o Grilled chicken on whole wheat pita with lettuce, tomatoes, cucumbers or tzatziki  o Tuna salad on whole wheat bread: tuna salad made with greek yogurt, olives, red peppers, capers, green onions o Garlic rosemary lamb pita: lamb sauted with garlic, rosemary, salt & pepper; add lettuce, cucumber, greek yogurt to pita - flavor with lemon juice and black pepper  . Seafood:  o Mediterranean grilled salmon, seasoned with garlic, basil, parsley, lemon juice and black pepper o Shrimp, lemon, and spinach whole-grain pasta salad made with low fat greek yogurt  o Seared scallops with lemon orzo  o Seared tuna steaks seasoned salt, pepper, coriander topped with tomato mixture of olives, tomatoes, olive oil, minced garlic, parsley, green onions and cappers  . Meats:  o Herbed greek chicken salad with kalamata olives, cucumber, feta  o Red bell peppers stuffed with spinach, bulgur, lean ground beef (or lentils) & topped with feta   o Kebabs: skewers of chicken,  tomatoes, onions, zucchini, squash  o [place] burgers: made with red onions, mint, dill, lemon juice, feta cheese topped with roasted red peppers . Vegetarian o Cucumber salad: cucumbers, artichoke hearts, celery, red onion, feta cheese, tossed in olive oil & lemon juice  o Hummus and whole grain pita points with a greek salad (lettuce, tomato, feta, olives, cucumbers, red onion) o Lentil soup with celery, carrots made with vegetable broth, garlic, salt and pepper  o Tabouli salad: parsley, bulgur, mint, scallions, cucumbers, tomato, radishes, lemon juice, olive oil, salt and pepper.

## 2014-10-16 ENCOUNTER — Encounter: Payer: Self-pay | Admitting: Family Medicine

## 2014-10-27 ENCOUNTER — Other Ambulatory Visit: Payer: Self-pay | Admitting: Family Medicine

## 2014-10-28 NOTE — Telephone Encounter (Signed)
Debbie, you just saw pt for check up, but don't see this med discussed. Can we give RFs until you wanted to see him back?

## 2014-10-29 ENCOUNTER — Other Ambulatory Visit: Payer: Self-pay | Admitting: Family Medicine

## 2015-01-31 ENCOUNTER — Other Ambulatory Visit: Payer: Self-pay | Admitting: Family Medicine

## 2015-02-07 ENCOUNTER — Telehealth: Payer: Self-pay | Admitting: Family Medicine

## 2015-02-07 NOTE — Telephone Encounter (Signed)
lmom of pt new appt time with Carlean Purl 04/13/15 at 8:15

## 2015-03-05 ENCOUNTER — Other Ambulatory Visit: Payer: Self-pay | Admitting: Family Medicine

## 2015-03-23 ENCOUNTER — Other Ambulatory Visit: Payer: Self-pay | Admitting: Family Medicine

## 2015-04-06 ENCOUNTER — Ambulatory Visit: Payer: Federal, State, Local not specified - PPO | Admitting: Family Medicine

## 2015-04-13 ENCOUNTER — Encounter: Payer: Self-pay | Admitting: Family Medicine

## 2015-04-13 ENCOUNTER — Ambulatory Visit (INDEPENDENT_AMBULATORY_CARE_PROVIDER_SITE_OTHER): Payer: Federal, State, Local not specified - PPO | Admitting: Family Medicine

## 2015-04-13 VITALS — BP 124/86 | HR 74 | Temp 99.1°F | Resp 18 | Ht 64.5 in | Wt 180.0 lb

## 2015-04-13 DIAGNOSIS — I10 Essential (primary) hypertension: Secondary | ICD-10-CM

## 2015-04-13 DIAGNOSIS — L720 Epidermal cyst: Secondary | ICD-10-CM

## 2015-04-13 DIAGNOSIS — R5382 Chronic fatigue, unspecified: Secondary | ICD-10-CM | POA: Diagnosis not present

## 2015-04-13 DIAGNOSIS — Z23 Encounter for immunization: Secondary | ICD-10-CM | POA: Diagnosis not present

## 2015-04-13 DIAGNOSIS — E789 Disorder of lipoprotein metabolism, unspecified: Secondary | ICD-10-CM | POA: Diagnosis not present

## 2015-04-13 LAB — BASIC METABOLIC PANEL
BUN: 18 mg/dL (ref 7–25)
CALCIUM: 9.4 mg/dL (ref 8.6–10.3)
CHLORIDE: 103 mmol/L (ref 98–110)
CO2: 26 mmol/L (ref 20–31)
Creat: 1.04 mg/dL (ref 0.60–1.35)
Glucose, Bld: 98 mg/dL (ref 65–99)
Potassium: 4.1 mmol/L (ref 3.5–5.3)
SODIUM: 139 mmol/L (ref 135–146)

## 2015-04-13 LAB — TSH: TSH: 1.563 u[IU]/mL (ref 0.350–4.500)

## 2015-04-13 MED ORDER — ATORVASTATIN CALCIUM 20 MG PO TABS: 20.0000 mg | ORAL_TABLET | Freq: Every day | ORAL | Status: DC

## 2015-04-13 NOTE — Patient Instructions (Signed)
Use a wash containing salicylic acid on your ear and can also do warm compresses.  Stop benadryl and try melatonin for at least 2 weeks Get 15-20 minutes of exercise about 5 days a week.     Insomnia Insomnia is a sleep disorder that makes it difficult to fall asleep or to stay asleep. Insomnia can cause tiredness (fatigue), low energy, difficulty concentrating, mood swings, and poor performance at work or school.  There are three different ways to classify insomnia:  Difficulty falling asleep.  Difficulty staying asleep.  Waking up too early in the morning. Any type of insomnia can be long-term (chronic) or short-term (acute). Both are common. Short-term insomnia usually lasts for three months or less. Chronic insomnia occurs at least three times a week for longer than three months. CAUSES  Insomnia may be caused by another condition, situation, or substance, such as:  Anxiety.  Certain medicines.  Gastroesophageal reflux disease (GERD) or other gastrointestinal conditions.  Asthma or other breathing conditions.  Restless legs syndrome, sleep apnea, or other sleep disorders.  Chronic pain.  Menopause. This may include hot flashes.  Stroke.  Abuse of alcohol, tobacco, or illegal drugs.  Depression.  Caffeine.   Neurological disorders, such as Alzheimer disease.  An overactive thyroid (hyperthyroidism). The cause of insomnia may not be known. RISK FACTORS Risk factors for insomnia include:  Gender. Women are more commonly affected than men.  Age. Insomnia is more common as you get older.  Stress. This may involve your professional or personal life.  Income. Insomnia is more common in people with lower income.  Lack of exercise.   Irregular work schedule or night shifts.  Traveling between different time zones. SIGNS AND SYMPTOMS If you have insomnia, trouble falling asleep or trouble staying asleep is the main symptom. This may lead to other symptoms,  such as:  Feeling fatigued.  Feeling nervous about going to sleep.  Not feeling rested in the morning.  Having trouble concentrating.  Feeling irritable, anxious, or depressed. TREATMENT  Treatment for insomnia depends on the cause. If your insomnia is caused by an underlying condition, treatment will focus on addressing the condition. Treatment may also include:   Medicines to help you sleep.  Counseling or therapy.  Lifestyle adjustments. HOME CARE INSTRUCTIONS   Take medicines only as directed by your health care provider.  Keep regular sleeping and waking hours. Avoid naps.  Keep a sleep diary to help you and your health care provider figure out what could be causing your insomnia. Include:   When you sleep.  When you wake up during the night.  How well you sleep.   How rested you feel the next day.  Any side effects of medicines you are taking.  What you eat and drink.   Make your bedroom a comfortable place where it is easy to fall asleep:  Put up shades or special blackout curtains to block light from outside.  Use a white noise machine to block noise.  Keep the temperature cool.   Exercise regularly as directed by your health care provider. Avoid exercising right before bedtime.  Use relaxation techniques to manage stress. Ask your health care provider to suggest some techniques that may work well for you. These may include:  Breathing exercises.  Routines to release muscle tension.  Visualizing peaceful scenes.  Cut back on alcohol, caffeinated beverages, and cigarettes, especially close to bedtime. These can disrupt your sleep.  Do not overeat or eat spicy foods right before  bedtime. This can lead to digestive discomfort that can make it hard for you to sleep.  Limit screen use before bedtime. This includes:  Watching TV.  Using your smartphone, tablet, and computer.  Stick to a routine. This can help you fall asleep faster. Try to do a  quiet activity, brush your teeth, and go to bed at the same time each night.  Get out of bed if you are still awake after 15 minutes of trying to sleep. Keep the lights down, but try reading or doing a quiet activity. When you feel sleepy, go back to bed.  Make sure that you drive carefully. Avoid driving if you feel very sleepy.  Keep all follow-up appointments as directed by your health care provider. This is important. SEEK MEDICAL CARE IF:   You are tired throughout the day or have trouble in your daily routine due to sleepiness.  You continue to have sleep problems or your sleep problems get worse. SEEK IMMEDIATE MEDICAL CARE IF:   You have serious thoughts about hurting yourself or someone else.   This information is not intended to replace advice given to you by your health care provider. Make sure you discuss any questions you have with your health care provider.   Document Released: 03/25/2000 Document Revised: 12/17/2014 Document Reviewed: 12/27/2013 Elsevier Interactive Patient Education [employer].

## 2015-04-13 NOTE — Progress Notes (Signed)
Subjective:    Patient ID: Paul Carson, male    DOB: [birth year], 50 y.o.   MRN: [id number]  HPI This is a pleasant 50 yo male who presents today for follow up of htn, depression and hyperlipidemia. He is doing well on current meds, denies side effects. His insurance formulary is changing and he needs to be changed from Crestor to a different statin.   Has noticed a place on his right ear that comes and goes, gets sore, drains and pain resolves.   Has always had difficulty falling asleep, has an elderly dog that awakens him nightly. Is able to go back to sleep. Never feels rested when he awakens, never has. Had sleep study in the past and was told he has mild obstruction, not to sleep on his back. He takes benadryl 75% of the time with improved falling asleep, but feels groggy in the morning. He drinks one can of Pepsi in the morning and occasional tea at lunch. He is not exercising. Has been watching his diet and has eliminated french fries and decreased his soda intake.   Past Medical History  Diagnosis Date  . Hypertension   . Hyperlipidemia   . Anxiety   . OCD (obsessive compulsive disorder)   . Allergy     seasonal   Past Surgical History  Procedure Laterality Date  . Carpal tunnel release  09/2010    left wrist  . Vasectomy    . Carpal tunel  2012    left wrist    Family History  Problem Relation Age of Onset  . Hypertension Father    Social History  Substance Use Topics  . Smoking status: Former Smoker -- 10 years    Types: Cigarettes    Quit date: 06/28/2001  . Smokeless tobacco: Never Used  . Alcohol Use: None     Review of Systems No chest pain, no SOB, no edema    Objective:   Physical Exam  Constitutional: He is oriented to person, place, and time. He appears well-developed and well-nourished.  HENT:  Head: Normocephalic and atraumatic.  Left Ear: External ear normal.  Ears:  Eyes: Conjunctivae are normal.  Neck: Normal range of motion. Neck  supple.  Cardiovascular: Normal rate, regular rhythm and normal heart sounds.   Pulmonary/Chest: Effort normal and breath sounds normal.  Musculoskeletal: Normal range of motion. He exhibits no edema.  Neurological: He is alert and oriented to person, place, and time.  Skin: Skin is warm and dry.  Psychiatric: He has a normal mood and affect. His behavior is normal. Judgment and thought content normal.  Vitals reviewed.  BP 124/86 mmHg  Pulse 74  Temp(Src) 99.1 F (37.3 C)  Resp 18  Ht 5' 4.5" (1.638 m)  Wt 180 lb (81.647 kg)  BMI 30.43 kg/m2  SpO2 97% Wt Readings from Last 3 Encounters:  04/13/15 180 lb (81.647 kg)  10/06/14 184 lb 6.4 oz (83.643 kg)  03/25/14 181 lb 12.8 oz (82.464 kg)   Depression screen [facility] 2/9 04/13/2015 10/06/2014 03/25/2014 09/24/2013  Decreased Interest 0 0 0 0  Down, Depressed, Hopeless 0 0 0 0  PHQ - 2 Score 0 0 0 0      Assessment & Plan:  1. Essential hypertension - Basic metabolic panel  2. Chronic fatigue - TSH  3. Flu vaccine need - Flu Vaccine QUAD 36+ mos IM  4. Lipid disorder - atorvastatin (LIPITOR) 20 MG tablet; Take 1 tablet (20 mg total) by mouth  daily.  Dispense: 90 tablet; Refill: 1 - recheck lipid panel in 6 months  5. Cyst of ear - on the verge of draining, encouraged him to use warm compresses and salicylic acid wash, can refer to derm if continues to bother him.   7. Insomnia - Provided written and verbal information regarding diagnosis and treatment. Discussed good sleep hygiene and importance of regular sleep schedule.  - encouraged him to stop benadryl and try melatonin - encouraged regular exercise for insomnia, fatigue and mood    Clarene Reamer, FNP-BC  [facility]  04/13/2015 9:18 AM

## 2015-05-04 ENCOUNTER — Other Ambulatory Visit: Payer: Self-pay | Admitting: Family Medicine

## 2015-10-06 ENCOUNTER — Encounter: Payer: Self-pay | Admitting: Family Medicine

## 2015-10-06 ENCOUNTER — Ambulatory Visit (INDEPENDENT_AMBULATORY_CARE_PROVIDER_SITE_OTHER): Payer: Federal, State, Local not specified - PPO | Admitting: Family Medicine

## 2015-10-06 VITALS — BP 124/82 | HR 63 | Temp 97.7°F | Resp 16 | Ht 65.0 in | Wt 174.0 lb

## 2015-10-06 DIAGNOSIS — F429 Obsessive-compulsive disorder, unspecified: Secondary | ICD-10-CM

## 2015-10-06 DIAGNOSIS — Z0000 Encounter for general adult medical examination without abnormal findings: Secondary | ICD-10-CM | POA: Diagnosis not present

## 2015-10-06 DIAGNOSIS — E789 Disorder of lipoprotein metabolism, unspecified: Secondary | ICD-10-CM

## 2015-10-06 DIAGNOSIS — I10 Essential (primary) hypertension: Secondary | ICD-10-CM | POA: Diagnosis not present

## 2015-10-06 MED ORDER — LISINOPRIL 5 MG PO TABS: 5.0000 mg | ORAL_TABLET | Freq: Every day | ORAL | Status: DC

## 2015-10-06 MED ORDER — PAROXETINE HCL 30 MG PO TABS: 30.0000 mg | ORAL_TABLET | Freq: Every day | ORAL | Status: DC

## 2015-10-06 MED ORDER — ATORVASTATIN CALCIUM 20 MG PO TABS: 20.0000 mg | ORAL_TABLET | Freq: Every day | ORAL | Status: DC

## 2015-10-06 NOTE — Progress Notes (Signed)
Subjective:    Patient ID: Paul Carson, male    DOB: 1965/10/31, 50 y.o.   MRN: [id number]  HPI This is a pleasant 50 yo male who presents today for CPE. Has had a good 6 months.   Last CPE- 10/06/14 Tdap- 04/11/2008 Flu- annual Dental- regular Eye- regular, wears glasses Exercise- just around house  Stress level low. Married with son and daughter. Mood good, energy level good, no compulsive behavior. Denies any medication side effects. Has been watching diet, has cut out french fries and is limiting soda intake.   Past Medical History  Diagnosis Date  . Hypertension   . Hyperlipidemia   . Anxiety   . OCD (obsessive compulsive disorder)   . Allergy     seasonal   Past Surgical History  Procedure Laterality Date  . Carpal tunnel release  09/2010    left wrist  . Vasectomy    . Carpal tunel  2012    left wrist    Family History  Problem Relation Age of Onset  . Hypertension Father    Social History  Substance Use Topics  . Smoking status: Former Smoker -- 10 years    Types: Cigarettes    Quit date: 06/28/2001  . Smokeless tobacco: Never Used  . Alcohol Use: None       Review of Systems  Constitutional: Negative.   HENT: Negative.   Eyes: Negative.   Respiratory: Negative.   Cardiovascular: Negative.   Gastrointestinal: Negative.   Endocrine: Negative.   Genitourinary: Negative.   Musculoskeletal: Negative.   Skin: Negative.   Allergic/Immunologic: Negative.   Neurological: Negative.   Hematological: Negative.   Psychiatric/Behavioral: Negative.        Objective:   Physical Exam Physical Exam  Constitutional: He is oriented to person, place, and time. He appears well-developed and well-nourished.  HENT:  Head: Normocephalic and atraumatic.  Right Ear: External ear normal.  Left Ear: External ear normal.  Nose: Nose normal.  Mouth/Throat: Oropharynx is clear and moist.  Eyes: Conjunctivae are normal. Pupils are equal, round, and reactive  to light.  Neck: Normal range of motion. Neck supple.  Cardiovascular: Normal rate, regular rhythm, normal heart sounds and intact distal pulses.   Pulmonary/Chest: Effort normal and breath sounds normal.  Abdominal: Soft. Bowel sounds are normal. Hernia confirmed negative in the right inguinal area and confirmed negative in the left inguinal area.  Genitourinary: Testes normal and penis normal. Circumcised.  Musculoskeletal: Normal range of motion. He exhibits no edema or tenderness.       Cervical back: Normal.       Thoracic back: Normal.       Lumbar back: Normal.  Lymphadenopathy:    He has no cervical adenopathy.       Right: No inguinal adenopathy present.       Left: No inguinal adenopathy present.  Neurological: He is alert and oriented to person, place, and time. He has normal reflexes.  Skin: Skin is warm and dry.  Psychiatric: He has a normal mood and affect. His behavior is normal. Judgment normal.  Vitals reviewed.     BP 124/82 mmHg  Pulse 63  Temp(Src) 97.7 F (36.5 C) (Oral)  Resp 16  Ht 5\' 5"  (1.651 m)  Wt 174 lb (78.926 kg)  BMI 28.96 kg/m2 Wt Readings from Last 3 Encounters:  10/06/15 174 lb (78.926 kg)  04/13/15 180 lb (81.647 kg)  10/06/14 184 lb 6.4 oz (83.643 kg)  Assessment & Plan:  1. Annual physical exam -- Discussed and encouraged healthy lifestyle choices- adequate sleep, regular exercise, stress management and healthy food choices.   2. Essential hypertension - COMPLETE METABOLIC PANEL WITH GFR - lisinopril (PRINIVIL,ZESTRIL) 5 MG tablet; Take 1 tablet (5 mg total) by mouth daily.  Dispense: 90 tablet; Refill: 1  3. Lipid disorder - COMPLETE METABOLIC PANEL WITH GFR - Lipid panel - atorvastatin (LIPITOR) 20 MG tablet; Take 1 tablet (20 mg total) by mouth daily.  Dispense: 90 tablet; Refill: 1  4. OCD (obsessive compulsive disorder) - PARoxetine (PAXIL) 30 MG tablet; Take 1 tablet (30 mg total) by mouth daily.  Dispense: 90 tablet;  Refill: 1  - follow up in 6 months Clarene Reamer, FNP-BC  [facility]  10/06/2015 10:08 AM

## 2015-10-06 NOTE — Patient Instructions (Addendum)
Keeping you healthy  Get these tests  Blood pressure- Have your blood pressure checked once a year by your healthcare provider.  Normal blood pressure is 120/80.  Weight- Have your body mass index (BMI) calculated to screen for obesity.  BMI is a measure of body fat based on height and weight. You can also calculate your own BMI at [email or web address].  Cholesterol- Have your cholesterol checked regularly starting at age 49, sooner may be necessary if you have diabetes, high blood pressure, if a family member developed heart diseases at an early age or if you smoke.   Chlamydia, HIV, and other sexual transmitted disease- Get screened each year until the age of 67 then within three months of each new sexual partner.  Diabetes- Have your blood sugar checked regularly if you have high blood pressure, high cholesterol, a family history of diabetes or if you are overweight.  Get these vaccines  Flu shot- Every fall.  Tetanus shot- Every 10 years.  Menactra- Single dose; prevents meningitis.  Take these steps  Don't smoke- If you do smoke, ask your healthcare provider about quitting. For tips on how to quit, go to www.smokefree.gov or call 1-800-QUIT-NOW.  Be physically active- Exercise 5 days a week for at least 30 minutes.  If you are not already physically active start slow and gradually work up to 30 minutes of moderate physical activity.  Examples of moderate activity include walking briskly, mowing the yard, dancing, swimming bicycling, etc.  Eat a healthy diet- Eat a variety of healthy foods such as fruits, vegetables, low fat milk, low fat cheese, yogurt, lean meats, poultry, fish, beans, tofu, etc.  For more information on healthy eating, go to www.thenutritionsource.org  Drink alcohol in moderation- Limit alcohol intake two drinks or less a day.  Never drink and drive.  Dentist- Brush and floss teeth twice daily; visit your dentis twice a year.  Depression-Your emotional  health is as important as your physical health.  If you're feeling down, losing interest in things you normally enjoy please talk with your healthcare provider.  Gun Safety- If you keep a gun in your home, keep it unloaded and with the safety lock on.  Bullets should be stored separately.  Helmet use- Always wear a helmet when riding a motorcycle, bicycle, rollerblading or skateboarding.  Safe sex- If you may be exposed to a sexually transmitted infection, use a condom  Seat belts- Seat bels can save your life; always wear one.  Smoke/Carbon Monoxide detectors- These detectors need to be installed on the appropriate level of your home.  Replace batteries at least once a year.  Skin Cancer- When out in the sun, cover up and use sunscreen SPF 15 or higher.  Violence- If anyone is threatening or hurting you, please tell your healthcare provider.    IF you received an x-ray today, you will receive an invoice from [facility]. Please contact [facility] at [phone] with questions or concerns regarding your invoice.   IF you received labwork today, you will receive an invoice from [employer]. Please contact [facility] at [phone] with questions or concerns regarding your invoice.   Our billing staff will not be able to assist you with questions regarding bills from these companies.  You will be contacted with the lab results as soon as they are available. The fastest way to get your results is to activate your My Chart account. Instructions are located on the last page of this paperwork. If you have  not heard from [place] regarding the results in 2 weeks, please contact this office.

## 2015-10-07 LAB — LIPID PANEL
CHOLESTEROL: 192 mg/dL (ref 125–200)
HDL: 49 mg/dL (ref >=40)
LDL CALC: 105 mg/dL (ref <130)
Total CHOL/HDL Ratio: 3.9 Ratio (ref <=5.0)
Triglycerides: 189 mg/dL — ABNORMAL HIGH (ref <150)
VLDL: 38 mg/dL — AB (ref <30)

## 2015-10-07 LAB — COMPLETE METABOLIC PANEL WITH GFR
ALT: 39 U/L (ref 9–46)
AST: 23 U/L (ref 10–40)
Albumin: 4.5 g/dL (ref 3.6–5.1)
Alkaline Phosphatase: 48 U/L (ref 40–115)
BILIRUBIN TOTAL: 1 mg/dL (ref 0.2–1.2)
BUN: 16 mg/dL (ref 7–25)
CHLORIDE: 102 mmol/L (ref 98–110)
CO2: 25 mmol/L (ref 20–31)
CREATININE: 0.97 mg/dL (ref 0.60–1.35)
Calcium: 9.7 mg/dL (ref 8.6–10.3)
Glucose, Bld: 90 mg/dL (ref 65–99)
Potassium: 4.1 mmol/L (ref 3.5–5.3)
SODIUM: 138 mmol/L (ref 135–146)
Total Protein: 7.1 g/dL (ref 6.1–8.1)

## 2015-10-12 ENCOUNTER — Encounter: Payer: Federal, State, Local not specified - PPO | Admitting: Family Medicine

## 2016-03-23 ENCOUNTER — Other Ambulatory Visit: Payer: Self-pay

## 2016-03-23 DIAGNOSIS — E789 Disorder of lipoprotein metabolism, unspecified: Secondary | ICD-10-CM

## 2016-03-23 MED ORDER — ATORVASTATIN CALCIUM 20 MG PO TABS: 20.0000 mg | ORAL_TABLET | Freq: Every day | ORAL | 1 refills | Status: DC

## 2016-03-23 NOTE — Telephone Encounter (Signed)
Fax req [facility] for atorvastatin- Has appt with Chelle on 12/19 sent

## 2016-03-29 ENCOUNTER — Encounter: Payer: Self-pay | Admitting: Physician Assistant

## 2016-03-29 ENCOUNTER — Ambulatory Visit (INDEPENDENT_AMBULATORY_CARE_PROVIDER_SITE_OTHER): Payer: Federal, State, Local not specified - PPO | Admitting: Physician Assistant

## 2016-03-29 VITALS — BP 118/80 | HR 67 | Temp 99.0°F | Resp 16 | Ht 65.0 in | Wt 179.2 lb

## 2016-03-29 DIAGNOSIS — I10 Essential (primary) hypertension: Secondary | ICD-10-CM | POA: Diagnosis not present

## 2016-03-29 DIAGNOSIS — Z23 Encounter for immunization: Secondary | ICD-10-CM

## 2016-03-29 DIAGNOSIS — F429 Obsessive-compulsive disorder, unspecified: Secondary | ICD-10-CM

## 2016-03-29 DIAGNOSIS — T07 Unspecified multiple injuries: Secondary | ICD-10-CM | POA: Diagnosis not present

## 2016-03-29 DIAGNOSIS — E789 Disorder of lipoprotein metabolism, unspecified: Secondary | ICD-10-CM

## 2016-03-29 DIAGNOSIS — Z114 Encounter for screening for human immunodeficiency virus [HIV]: Secondary | ICD-10-CM | POA: Diagnosis not present

## 2016-03-29 MED ORDER — PAROXETINE HCL 30 MG PO TABS: 30.0000 mg | ORAL_TABLET | Freq: Every day | ORAL | 1 refills | Status: DC

## 2016-03-29 MED ORDER — LISINOPRIL 5 MG PO TABS: 5.0000 mg | ORAL_TABLET | Freq: Every day | ORAL | 1 refills | Status: DC

## 2016-03-29 NOTE — Patient Instructions (Signed)
     IF you received an x-ray today, you will receive an invoice from [facility]. Please contact [facility] at [phone] with questions or concerns regarding your invoice.   IF you received labwork today, you will receive an invoice from [facility]. Please contact [facility] at [phone] with questions or concerns regarding your invoice.   Our billing staff will not be able to assist you with questions regarding bills from these companies.  You will be contacted with the lab results as soon as they are available. The fastest way to get your results is to activate your My Chart account. Instructions are located on the last page of this paperwork. If you have not heard from us regarding the results in 2 weeks, please contact this office.     

## 2016-03-29 NOTE — Progress Notes (Signed)
Patient ID: Paul Carson, male     DOB: 01-06-1966, 50 y.o.    MRN: [id number]  PCP: No PCP Per Patient  Chief Complaint  Patient presents with  . Follow-up    70mth follow up     Subjective:   This patient is new to me and presents for evaluation of hyperlipidemia and HTN.  Last visit was 09/2015, a CPE with Ms. Carlean Purl, [place]. At that time, he'd lost 10 lbs in the previous 12 months, TC 192, TG 189, HDL 49 and LDL 105.  He feels well, without problems or concerns. Medications are working well for him, without adverse effects. Notes he's gained back some of the weight he'd lost over the previous year.  Review of Systems  Constitutional: Negative for chills and fever.  Respiratory: Negative for cough and shortness of breath.   Cardiovascular: Negative for chest pain, palpitations and leg swelling.  Gastrointestinal: Negative for diarrhea, nausea and vomiting.  Endocrine: Negative for polydipsia.  Genitourinary: Negative for dysuria, frequency and urgency.  Musculoskeletal: Negative for myalgias.  Skin: Negative for rash.  Neurological: Negative for dizziness and headaches.  Psychiatric/Behavioral: Negative for agitation, behavioral problems and dysphoric mood. The patient is not nervous/anxious.      Prior to Admission medications   Medication Sig Start Date End Date Taking? Authorizing Provider  atorvastatin (LIPITOR) 20 MG tablet Take 1 tablet (20 mg total) by mouth daily. 03/23/16   Aleksa Collinsworth, PA-C  ibuprofen (ADVIL,MOTRIN) 800 MG tablet Take 1 tablet (800 mg total) by mouth every 8 (eight) hours as needed for pain. 03/27/12   Orma Flaming, MD  lisinopril (PRINIVIL,ZESTRIL) 5 MG tablet Take 1 tablet (5 mg total) by mouth daily. 10/06/15   Elby Beck, FNP  PARoxetine (PAXIL) 30 MG tablet Take 1 tablet (30 mg total) by mouth daily. 10/06/15   Elby Beck, FNP     No Known Allergies   Patient Active Problem List   Diagnosis Date Noted  . Gout  03/30/2012  . HTN (hypertension) 03/27/2012  . Lipid disorder 03/27/2012     Family History  Problem Relation Age of Onset  . Hypertension Father      Social History   Social History  . Marital status: Married    Spouse name: Ivin Booty  . Number of children: 2  . 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 Smoking status: Former Smoker    Years: 10.00    Types: Cigarettes    Quit date: 06/28/2001  . Smokeless tobacco: Never Used  . Alcohol use Not on file  . Drug use: No  . Sexual activity: Not on file   Other Topics Concern  . Not on file   Social History Narrative   Lives with his wife and children.   Smoke detectors in home? Yes    Guns in home? No    Consistent seatbelt use? Yes    Consistent dental brushing and flossing? Yes    Semi-Annual dental visits: Yes    Annual Eye exams: Yes             Objective:  Physical Exam  Constitutional: He is oriented to person, place, and time. He appears well-developed and well-nourished. He is active and cooperative. No distress.  BP 118/80 (BP Location: Left Arm, Patient Position: Sitting, Cuff Size: Small)   Pulse 67   Temp 99 F (37.2 C) (Oral)  Resp 16   Ht 5\' 5"  (1.651 m)   Wt 179 lb 3.2 oz (81.3 kg)   SpO2 96%   BMI 29.82 kg/m   HENT:  Head: Normocephalic and atraumatic.  Right Ear: Hearing normal.  Left Ear: Hearing normal.  Eyes: Conjunctivae are normal. No scleral icterus.  Neck: Normal range of motion. Neck supple. No thyromegaly present.  Cardiovascular: Normal rate, regular rhythm and normal heart sounds.   Pulses:      Radial pulses are 2+ on the right side, and 2+ on the left side.  Pulmonary/Chest: Effort normal and breath sounds normal.  Lymphadenopathy:       Head (right side): No tonsillar, no preauricular, no posterior auricular and no occipital adenopathy present.       Head (left side): No tonsillar, no preauricular, no  posterior auricular and no occipital adenopathy present.    He has no cervical adenopathy.       Right: No supraclavicular adenopathy present.       Left: No supraclavicular adenopathy present.  Neurological: He is alert and oriented to person, place, and time. No sensory deficit.  Skin: Skin is warm, dry and intact. No rash noted. No cyanosis or erythema. Nails show no clubbing.  Psychiatric: He has a normal mood and affect. His speech is normal and behavior is normal.     Wt Readings from Last 3 Encounters:  03/29/16 179 lb 3.2 oz (81.3 kg)  10/06/15 174 lb (78.9 kg)  04/13/15 180 lb (81.6 kg)       Assessment & Plan:  1. Essential hypertension Controlled. Continue current treatment. - Comprehensive metabolic panel - lisinopril (PRINIVIL,ZESTRIL) 5 MG tablet; Take 1 tablet (5 mg total) by mouth daily.  Dispense: 90 tablet; Refill: 1  2. Lipid disorder Await labs. Adjust regimen as indicated by results. - Comprehensive metabolic panel - Lipid panel  3. Need for influenza vaccination - Flu Vaccine QUAD 36+ mos PF IM (Fluarix & Fluzone Quad PF)  4. Screening for HIV (human immunodeficiency virus) - HIV antibody  5. Obsessive-compulsive disorder, unspecified type Stable. Controlled. Continue current treatment. - PARoxetine (PAXIL) 30 MG tablet; Take 1 tablet (30 mg total) by mouth daily.  Dispense: 90 tablet; Refill: 1   Return in about 6 months (around 09/27/2016) for Annual Wellness Visit.   Fara Chute, PA-C Physician Assistant-Certified [facility]

## 2016-03-30 LAB — LIPID PANEL
CHOLESTEROL TOTAL: 196 mg/dL (ref 100–199)
Chol/HDL Ratio: 3.8 ratio units (ref 0.0–5.0)
HDL: 52 mg/dL (ref >39)
LDL Calculated: 122 mg/dL — ABNORMAL HIGH (ref 0–99)
TRIGLYCERIDES: 108 mg/dL (ref 0–149)
VLDL CHOLESTEROL CAL: 22 mg/dL (ref 5–40)

## 2016-03-30 LAB — COMPREHENSIVE METABOLIC PANEL
ALK PHOS: 51 IU/L (ref 39–117)
ALT: 35 IU/L (ref 0–44)
AST: 25 IU/L (ref 0–40)
Albumin/Globulin Ratio: 1.6 (ref 1.2–2.2)
Albumin: 4.4 g/dL (ref 3.5–5.5)
BUN/Creatinine Ratio: 13 (ref 9–20)
BUN: 13 mg/dL (ref 6–24)
Bilirubin Total: 1 mg/dL (ref 0.0–1.2)
CO2: 26 mmol/L (ref 18–29)
CREATININE: 0.99 mg/dL (ref 0.76–1.27)
Calcium: 9.6 mg/dL (ref 8.7–10.2)
Chloride: 100 mmol/L (ref 96–106)
GFR calc Af Amer: 103 mL/min/{1.73_m2} (ref >59)
GFR calc non Af Amer: 89 mL/min/{1.73_m2} (ref >59)
GLOBULIN, TOTAL: 2.7 g/dL (ref 1.5–4.5)
Glucose: 101 mg/dL — ABNORMAL HIGH (ref 65–99)
POTASSIUM: 4.9 mmol/L (ref 3.5–5.2)
SODIUM: 142 mmol/L (ref 134–144)
Total Protein: 7.1 g/dL (ref 6.0–8.5)

## 2016-03-30 LAB — HIV ANTIBODY (ROUTINE TESTING W REFLEX): HIV SCREEN 4TH GENERATION: NONREACTIVE

## 2016-04-01 ENCOUNTER — Encounter: Payer: Self-pay | Admitting: Physician Assistant

## 2016-09-26 ENCOUNTER — Other Ambulatory Visit: Payer: Self-pay | Admitting: Physician Assistant

## 2016-09-26 DIAGNOSIS — E789 Disorder of lipoprotein metabolism, unspecified: Secondary | ICD-10-CM

## 2016-10-11 ENCOUNTER — Encounter: Payer: Federal, State, Local not specified - PPO | Admitting: Physician Assistant

## 2016-11-16 ENCOUNTER — Other Ambulatory Visit: Payer: Self-pay | Admitting: Physician Assistant

## 2016-11-16 DIAGNOSIS — I10 Essential (primary) hypertension: Secondary | ICD-10-CM

## 2016-11-16 DIAGNOSIS — F429 Obsessive-compulsive disorder, unspecified: Secondary | ICD-10-CM

## 2016-12-13 ENCOUNTER — Other Ambulatory Visit: Payer: Self-pay | Admitting: Physician Assistant

## 2016-12-13 DIAGNOSIS — I10 Essential (primary) hypertension: Secondary | ICD-10-CM

## 2016-12-13 DIAGNOSIS — F429 Obsessive-compulsive disorder, unspecified: Secondary | ICD-10-CM

## 2016-12-19 NOTE — Telephone Encounter (Signed)
MyChart message sent to pt about making an apt for more refills

## 2017-01-03 ENCOUNTER — Ambulatory Visit (INDEPENDENT_AMBULATORY_CARE_PROVIDER_SITE_OTHER): Payer: Federal, State, Local not specified - PPO | Admitting: Physician Assistant

## 2017-01-03 ENCOUNTER — Encounter: Payer: Self-pay | Admitting: Physician Assistant

## 2017-01-03 VITALS — BP 124/81 | HR 68 | Temp 98.3°F | Resp 18 | Ht 65.0 in | Wt 176.8 lb

## 2017-01-03 DIAGNOSIS — E789 Disorder of lipoprotein metabolism, unspecified: Secondary | ICD-10-CM | POA: Diagnosis not present

## 2017-01-03 DIAGNOSIS — F429 Obsessive-compulsive disorder, unspecified: Secondary | ICD-10-CM

## 2017-01-03 DIAGNOSIS — I10 Essential (primary) hypertension: Secondary | ICD-10-CM

## 2017-01-03 DIAGNOSIS — Z1211 Encounter for screening for malignant neoplasm of colon: Secondary | ICD-10-CM

## 2017-01-03 DIAGNOSIS — R0681 Apnea, not elsewhere classified: Secondary | ICD-10-CM | POA: Diagnosis not present

## 2017-01-03 DIAGNOSIS — Z23 Encounter for immunization: Secondary | ICD-10-CM

## 2017-01-03 MED ORDER — ATORVASTATIN CALCIUM 20 MG PO TABS: 20.0000 mg | ORAL_TABLET | Freq: Every day | ORAL | 3 refills | Status: DC

## 2017-01-03 MED ORDER — PAROXETINE HCL 30 MG PO TABS: 30.0000 mg | ORAL_TABLET | Freq: Every day | ORAL | 3 refills | Status: DC

## 2017-01-03 MED ORDER — LISINOPRIL 5 MG PO TABS: 5.0000 mg | ORAL_TABLET | Freq: Every day | ORAL | 3 refills | Status: DC

## 2017-01-03 NOTE — Patient Instructions (Signed)
     IF you received an x-ray today, you will receive an invoice from [facility]. Please contact [facility] at [phone] with questions or concerns regarding your invoice.   IF you received labwork today, you will receive an invoice from [facility]. Please contact [facility] at [phone] with questions or concerns regarding your invoice.   Our billing staff will not be able to assist you with questions regarding bills from these companies.  You will be contacted with the lab results as soon as they are available. The fastest way to get your results is to activate your My Chart account. Instructions are located on the last page of this paperwork. If you have not heard from us regarding the results in 2 weeks, please contact this office.     

## 2017-01-03 NOTE — Progress Notes (Signed)
Patient ID: Paul Carson, male    DOB: Sep 05, 1965, 51 y.o.   MRN: [id number]  PCP: Harrison Mons, PA-C  Chief Complaint  Patient presents with  . Medication Refill    Lipitor 20 MG, Lisinopril 5 MG, Paxil 30 MG    Subjective:   Presents for medication refills.  He is due for follow-up of HTN and hyperlipidemia. He feels well, tolerates his medications. However, he relates daytime sleepiness. He did have a sleep study, but it wasn't conclusive, as he wasn't able to sleep during the test. He'd like to try a home study, noting that he has had witnessed apnea when sleeping on his back. He also has difficulty falling asleep, and uses melatonin regularly..  Diet: fast food, take out. Drinking 1 soda a day. Reports daily BM. Exercise: not enough  Last eye exam: a month ago, same prescription new glasses. Last dental exam: a month ago  Review of Systems Constitutional: Positive for fatigue. Negative for appetite change, chills, diaphoresis, fever and unexpected weight change.  HENT: Negative for congestion, ear pain, hearing loss, mouth sores, rhinorrhea, sinus pain, sinus pressure, sneezing, sore throat, tinnitus and trouble swallowing.   Eyes: Negative for visual disturbance.  Respiratory: Positive for apnea (when sleeping). Negative for cough, choking, chest tightness, shortness of breath and wheezing.   Cardiovascular: Negative for chest pain, palpitations and leg swelling.  Gastrointestinal: Negative for abdominal pain, blood in stool, constipation, diarrhea, nausea and vomiting.  Genitourinary: Negative for difficulty urinating, dysuria, frequency and urgency.  Musculoskeletal: Positive for arthralgias. Negative for myalgias.  Neurological: Negative for dizziness, light-headedness and headaches.     Patient Active Problem List   Diagnosis Date Noted  . Gout 03/30/2012  . HTN (hypertension) 03/27/2012  . Lipid disorder 03/27/2012     Prior to Admission  medications   Medication Sig Start Date End Date Taking? Authorizing Provider  atorvastatin (LIPITOR) 20 MG tablet TAKE 1 TABLET (20 MG TOTAL) BY MOUTH DAILY. 09/26/16  Yes Lanessa Shill, PA-C  lisinopril (PRINIVIL,ZESTRIL) 5 MG tablet TAKE 1 TABLET BY MOUTH EVERY DAY 12/16/16  Yes Kyliana Standen, PA-C  PARoxetine (PAXIL) 30 MG tablet TAKE 1 TABLET BY MOUTH EVERY DAY 12/16/16  Yes Dannie Woolen, PA-C     No Known Allergies     Objective:  Physical Exam  Constitutional: He is oriented to person, place, and time. He appears well-developed and well-nourished. He is active and cooperative. No distress.  BP 124/81 (BP Location: Left Arm, Patient Position: Sitting, Cuff Size: Normal)   Pulse 68   Temp 98.3 F (36.8 C) (Oral)   Resp 18   Ht 5\' 5"  (1.651 m)   Wt 176 lb 12.8 oz (80.2 kg)   SpO2 97%   BMI 29.42 kg/m   HENT:  Head: Normocephalic and atraumatic.  Right Ear: Hearing normal.  Left Ear: Hearing normal.  Eyes: Conjunctivae are normal. No scleral icterus.  Neck: Normal range of motion. Neck supple. No thyromegaly present.  Cardiovascular: Normal rate, regular rhythm and normal heart sounds.   Pulses:      Radial pulses are 2+ on the right side, and 2+ on the left side.  Pulmonary/Chest: Effort normal and breath sounds normal.  Lymphadenopathy:       Head (right side): No tonsillar, no preauricular, no posterior auricular and no occipital adenopathy present.       Head (left side): No tonsillar, no preauricular, no posterior auricular and no occipital adenopathy present.  He has no cervical adenopathy.       Right: No supraclavicular adenopathy present.       Left: No supraclavicular adenopathy present.  Neurological: He is alert and oriented to person, place, and time. No sensory deficit.  Skin: Skin is warm, dry and intact. No rash noted. No cyanosis or erythema. Nails show no clubbing.  Psychiatric: He has a normal mood and affect. His speech is normal and behavior is  normal.           Assessment & Plan:   Problem List Items Addressed This Visit    HTN (hypertension) - Primary    Controlled. Continue lisinopril.      Relevant Medications   lisinopril (PRINIVIL,ZESTRIL) 5 MG tablet   atorvastatin (LIPITOR) 20 MG tablet   Other Relevant Orders   CBC with Differential/Platelet (Completed)   Comprehensive metabolic panel (Completed)   Lipid disorder    Await labs. Adjust regimen as indicated by results.      Relevant Medications   atorvastatin (LIPITOR) 20 MG tablet   Other Relevant Orders   Comprehensive metabolic panel (Completed)   Lipid panel (Completed)   Obsessive-compulsive disorder    Stable. Controlled. Continue paroxetine.      Relevant Medications   PARoxetine (PAXIL) 30 MG tablet    Other Visit Diagnoses    Witnessed episode of apnea       Relevant Orders   Ambulatory referral to Sleep Studies   Screening for colon cancer       Relevant Orders   Ambulatory referral to Gastroenterology   Need for influenza vaccination       Relevant Orders   Flu Vaccine QUAD 36+ mos IM (Completed)       Return in about 6 months (around 07/03/2017) for re-evalaution of cholesterol and blood pressure.   Fara Chute, PA-C [facility] [place]

## 2017-01-03 NOTE — Progress Notes (Signed)
Subjective:    Patient ID: Paul Carson, male    DOB: Dec 09, 1965, 51 y.o.   MRN: [id number]  HPI Patient returns today for 6 month follow up of hypertension and hyperlipidemia, as well as medication refill. He is compliant with his medications and is taking them daily. Patient takes his blood pressure once a month. He notes he is tolerating his Lisinopril and Atorvastatin well, denies any dry cough or muscle pain.   He reports significant daytime sleepiness. He had a polysomnogram done but had difficulty sleeping during the study, so the study results were limited. He is interested in having a sleep study done at his home. He states he will gasp for air when he is sleeping on his back as witness by his wife. He reports difficulty falling asleep at night. He takes a melatonin every night.   He describes occasional bilateral elbow pain noticeably at night, which has improved over the last year.   Diet: fast food, take out. Drinking 1 soda a day. Reports daily BM. Exercise: not enough  Last eye exam: a month ago, same prescription new glasses. Last dental exam: a month ago  Review of Systems  Constitutional: Positive for fatigue. Negative for appetite change, chills, diaphoresis, fever and unexpected weight change.  HENT: Negative for congestion, ear pain, hearing loss, mouth sores, rhinorrhea, sinus pain, sinus pressure, sneezing, sore throat, tinnitus and trouble swallowing.   Eyes: Negative for visual disturbance.  Respiratory: Positive for apnea (when sleeping). Negative for cough, choking, chest tightness, shortness of breath and wheezing.   Cardiovascular: Negative for chest pain, palpitations and leg swelling.  Gastrointestinal: Negative for abdominal pain, blood in stool, constipation, diarrhea, nausea and vomiting.  Genitourinary: Negative for difficulty urinating, dysuria, frequency and urgency.  Musculoskeletal: Positive for arthralgias. Negative for myalgias.  Neurological:  Negative for dizziness, light-headedness and headaches.   Prior to Admission medications   Medication Sig Start Date End Date Taking? Authorizing Provider  atorvastatin (LIPITOR) 20 MG tablet TAKE 1 TABLET (20 MG TOTAL) BY MOUTH DAILY. 09/26/16  Yes Jeffery, Chelle, PA-C  lisinopril (PRINIVIL,ZESTRIL) 5 MG tablet TAKE 1 TABLET BY MOUTH EVERY DAY 12/16/16  Yes Jeffery, Chelle, PA-C  PARoxetine (PAXIL) 30 MG tablet TAKE 1 TABLET BY MOUTH EVERY DAY 12/16/16  Yes Jeffery, Chelle, PA-C   No Known Allergies Social History   Social History  . Marital status: Married    Spouse name: Ivin Booty  . Number of children: 2  . 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 Smoking status: Former Smoker    Years: 10.00    Types: Cigarettes    Quit date: 06/28/2001  . Smokeless tobacco: Never Used  . Alcohol use No  . Drug use: No  . Sexual activity: Yes   Other Topics Concern  . Not on file   Social History Narrative   Lives with his wife and children.   Smoke detectors in home? Yes    Guns in home? No    Consistent seatbelt use? Yes    Consistent dental brushing? Yes Flossing? No   Semi-Annual dental visits: Yes    Annual Eye exams: Yes       Patient Active Problem List   Diagnosis Date Noted  . Gout 03/30/2012  . HTN (hypertension) 03/27/2012  . Lipid disorder 03/27/2012      Objective:   Physical Exam  Constitutional: He is oriented to person, place,  and time. He appears well-developed and well-nourished. He appears distressed.  HENT:  Head: Normocephalic and atraumatic.  Right Ear: External ear normal.  Left Ear: External ear normal.  Eyes: Pupils are equal, round, and reactive to light.  Neck: No JVD present.  No carotid bruits  Cardiovascular: Normal rate, regular rhythm and intact distal pulses.  Exam reveals gallop.   Pulmonary/Chest: Breath sounds normal. No respiratory distress. He has no wheezes. He has no rales. He  exhibits no tenderness.  Neurological: He is alert and oriented to person, place, and time.  Skin: Skin is warm and dry. No rash noted. He is not diaphoretic. No erythema. No pallor.  Psychiatric: He has a normal mood and affect. His behavior is normal. Judgment and thought content normal.      Assessment & Plan:   1. Essential hypertension - Controlled, Continue with current treatment. - lisinopril (PRINIVIL,ZESTRIL) 5 MG tablet; Take 1 tablet (5 mg total) by mouth daily.  Dispense: 90 tablet; Refill: 3 - CBC with Differential/Platelet - Comprehensive metabolic panel  2. Lipid disorder - atorvastatin (LIPITOR) 20 MG tablet; Take 1 tablet (20 mg total) by mouth daily.  Dispense: 90 tablet; Refill: 3 - Comprehensive metabolic panel - Lipid panel  3. Obsessive-compulsive disorder, unspecified type - PARoxetine (PAXIL) 30 MG tablet; Take 1 tablet (30 mg total) by mouth daily.  Dispense: 90 tablet; Refill: 3  4. Witnessed episode of apnea - Ambulatory referral to Sleep Studies  5. Screening for colon cancer - Ambulatory referral to Gastroenterology  6. Need for influenza vaccination - Flu Vaccine QUAD 36+ mos IM  Respectfully, Denny Levy PA-S 2019

## 2017-01-04 LAB — CBC WITH DIFFERENTIAL/PLATELET
BASOS ABS: 0 10*3/uL (ref 0.0–0.2)
Basos: 0 %
EOS (ABSOLUTE): 0.1 10*3/uL (ref 0.0–0.4)
Eos: 2 %
HEMOGLOBIN: 16.4 g/dL (ref 13.0–17.7)
Hematocrit: 47.8 % (ref 37.5–51.0)
IMMATURE GRANS (ABS): 0 10*3/uL (ref 0.0–0.1)
IMMATURE GRANULOCYTES: 0 %
LYMPHS: 18 %
Lymphocytes Absolute: 1.1 10*3/uL (ref 0.7–3.1)
MCH: 29.9 pg (ref 26.6–33.0)
MCHC: 34.3 g/dL (ref 31.5–35.7)
MCV: 87 fL (ref 79–97)
MONOCYTES: 12 %
Monocytes Absolute: 0.7 10*3/uL (ref 0.1–0.9)
NEUTROS PCT: 68 %
Neutrophils Absolute: 3.9 10*3/uL (ref 1.4–7.0)
Platelets: 329 10*3/uL (ref 150–379)
RBC: 5.48 x10E6/uL (ref 4.14–5.80)
RDW: 13.9 % (ref 12.3–15.4)
WBC: 5.8 10*3/uL (ref 3.4–10.8)

## 2017-01-04 LAB — COMPREHENSIVE METABOLIC PANEL
A/G RATIO: 1.7 (ref 1.2–2.2)
ALT: 29 IU/L (ref 0–44)
AST: 21 IU/L (ref 0–40)
Albumin: 4.4 g/dL (ref 3.5–5.5)
Alkaline Phosphatase: 54 IU/L (ref 39–117)
BUN/Creatinine Ratio: 20 (ref 9–20)
BUN: 20 mg/dL (ref 6–24)
Bilirubin Total: 0.7 mg/dL (ref 0.0–1.2)
CALCIUM: 9.4 mg/dL (ref 8.7–10.2)
CO2: 23 mmol/L (ref 20–29)
CREATININE: 1.02 mg/dL (ref 0.76–1.27)
Chloride: 100 mmol/L (ref 96–106)
GFR, EST AFRICAN AMERICAN: 99 mL/min/{1.73_m2} (ref >59)
GFR, EST NON AFRICAN AMERICAN: 85 mL/min/{1.73_m2} (ref >59)
GLOBULIN, TOTAL: 2.6 g/dL (ref 1.5–4.5)
Glucose: 101 mg/dL — ABNORMAL HIGH (ref 65–99)
POTASSIUM: 4.5 mmol/L (ref 3.5–5.2)
Sodium: 140 mmol/L (ref 134–144)
TOTAL PROTEIN: 7 g/dL (ref 6.0–8.5)

## 2017-01-04 LAB — LIPID PANEL
CHOL/HDL RATIO: 3.4 ratio (ref 0.0–5.0)
Cholesterol, Total: 176 mg/dL (ref 100–199)
HDL: 52 mg/dL (ref >39)
LDL Calculated: 100 mg/dL — ABNORMAL HIGH (ref 0–99)
TRIGLYCERIDES: 120 mg/dL (ref 0–149)
VLDL Cholesterol Cal: 24 mg/dL (ref 5–40)

## 2017-01-07 DIAGNOSIS — F429 Obsessive-compulsive disorder, unspecified: Secondary | ICD-10-CM | POA: Insufficient documentation

## 2017-01-07 NOTE — Assessment & Plan Note (Signed)
Stable. Controlled. Continue paroxetine.

## 2017-01-07 NOTE — Assessment & Plan Note (Signed)
>>  ASSESSMENT AND PLAN FOR LIPID DISORDER WRITTEN ON 01/07/2017  9:49 PM BY JEFFERY, CHELLE, PA-C  Await labs. Adjust regimen as indicated by results.

## 2017-01-07 NOTE — Assessment & Plan Note (Signed)
Controlled.  Continue lisinopril 

## 2017-01-07 NOTE — Assessment & Plan Note (Signed)
Await labs. Adjust regimen as indicated by results.  

## 2017-02-02 ENCOUNTER — Encounter: Payer: Self-pay | Admitting: Gastroenterology

## 2017-03-16 ENCOUNTER — Ambulatory Visit: Payer: Federal, State, Local not specified - PPO | Admitting: Pulmonary Disease

## 2017-03-16 ENCOUNTER — Encounter: Payer: Self-pay | Admitting: Pulmonary Disease

## 2017-03-16 VITALS — BP 118/82 | HR 66 | Ht 67.0 in | Wt 179.8 lb

## 2017-03-16 DIAGNOSIS — G4733 Obstructive sleep apnea (adult) (pediatric): Secondary | ICD-10-CM | POA: Diagnosis not present

## 2017-03-16 NOTE — Patient Instructions (Signed)
Will arrange for home sleep study Will call to arrange for follow up after sleep study reviewed  

## 2017-03-16 NOTE — Progress Notes (Signed)
Past Surgical History Paul Carson  has a past surgical history that includes Carpal tunnel release (09/2010); Vasectomy; and carpal tunel (2012).  No Known Allergies  Family History Paul Carson family history includes Hypertension in Paul Carson father.  Social History Paul Carson  reports that Paul Carson quit smoking about 18 years ago. Paul Carson smoking use included cigarettes. Paul Carson has a 5.00 pack-year smoking history. Paul Carson has never used smokeless tobacco. Paul Carson reports that Paul Carson does not drink alcohol or use drugs.  Review of systems Constitutional: Negative for fever and unexpected weight change.  HENT: Negative for congestion, dental problem, ear pain, nosebleeds, postnasal drip, rhinorrhea, sinus pressure, sneezing, sore throat and trouble swallowing.   Eyes: Negative for redness and itching.  Respiratory: Negative for cough, chest tightness, shortness of breath and wheezing.   Cardiovascular: Negative for palpitations and leg swelling.  Gastrointestinal: Negative for nausea and vomiting.  Genitourinary: Negative for dysuria.  Musculoskeletal: Negative for joint swelling.  Skin: Negative for rash.  Allergic/Immunologic: Positive for environmental allergies. Negative for food allergies and immunocompromised state.  Neurological: Negative for headaches.  Hematological: Does not bruise/bleed easily.  Psychiatric/Behavioral: Negative for dysphoric mood. The patient is nervous/anxious.     Current Outpatient Medications on File Prior to Visit  Medication Sig  . atorvastatin (LIPITOR) 20 MG tablet Take 1 tablet (20 mg total) by mouth daily.  Marland Kitchen lisinopril (PRINIVIL,ZESTRIL) 5 MG tablet Take 1 tablet (5 mg total) by mouth daily.  . Melatonin 5 MG TABS Take by mouth at bedtime as needed.  Marland Kitchen PARoxetine (PAXIL) 30 MG tablet Take 1 tablet (30 mg total) by mouth daily.   No current facility-administered medications on file prior to visit.     Chief Complaint  Patient presents with  . sleep consult    Paul Carson referred by Dr. Rulon Abide MD.  Paul Carson lays flat on back and stop breathing and will wake up gasps for air. Paul Carson sleeps around 6-7 hours a night, wakes up 2-3 times each night. On the days off Paul Carson will sleep past lunch wake up around 2pm.     Sleep tests PSG 03/11/10 >> AHI 5.5, supine AHI 56.8, SpO2 low 60%  Past medical history Paul Carson  has a past medical history of Allergy, Anxiety, Hyperlipidemia, Hypertension, and OCD (obsessive compulsive disorder).  Vital signs BP 118/82 (BP Location: Left Arm, Cuff Size: Normal)   Pulse 66   Ht 5\' 7"  (1.702 m)   Wt 179 lb 12.8 oz (81.6 kg)   SpO2 98%   BMI 28.16 kg/m   History of Present Illness Paul Carson is a 51 y.o. male for evaluation of sleep problems.  Paul Carson has been concerned about Paul Carson sleep for years.  Paul Carson had a sleep study in 2011 that showed mild sleep apnea overall, but severe in supine sleep.  Paul Carson was advised about positional therapy.  Paul Carson doesn't like sleeping on is side >> Paul Carson arms get numb.  Paul Carson is still snoring and stops breathing while asleep.  This happens on Paul Carson back and side.  Paul Carson will sometimes act out Paul Carson dreams, and punch or bite Paul Carson wife before waking up.  Paul Carson denies history of head injury, seizures, or family hx of movement disorders.  Paul Carson is tired all day long.  Paul Carson has to force himself to stay awake at work.  Paul Carson goes to sleep between 930 and 10 pm.  Paul Carson falls asleep in 2 hours.  Paul Carson wakes up 2 times to use the bathroom.  Paul Carson gets out of bed at 645  am.  Paul Carson feels exhausted in the morning.  Paul Carson denies morning headache.  Paul Carson does not use anything to help him fall sleep.  Paul Carson drinks a Pepsi in the morning and tea at lunch to get the caffeine.  Paul Carson denies sleep walking, sleep talking, bruxism, or nightmares.  There is no history of restless legs.  Paul Carson denies sleep hallucinations, sleep paralysis, or cataplexy.  The Epworth score is 10 out of 24.   Physical Exam:  General - pleasant Eyes - pupils reactive ENT - no sinus tenderness, no oral exudate, no LAN, MP 4, mild overbite,  scalloped tongue Cardiac - regular, no murmur Chest - no wheeze, rales Abd - soft, non tender Ext - no edema Skin - no rashes Neuro - normal strength Psych - normal mood   Discussion: Paul Carson has snoring, sleep disruption, apnea, and daytime sleepiness.  Paul Carson also acts out Paul Carson dreams.  Paul Carson has history of hypertension.  Paul Carson had prior sleep study that showed sleep apnea.  I am concerned Paul Carson could still have sleep apnea.  We discussed how sleep apnea can affect various health problems, including risks for hypertension, cardiovascular disease, and diabetes.  We also discussed how sleep disruption can increase risks for accidents, such as while driving.  Weight loss as a means of improving sleep apnea was also reviewed.  Additional treatment options discussed were CPAP therapy, oral appliance, and surgical intervention.   Assessment/plan:  Obstructive sleep apnea. - will arrange for a home sleep study to further asses   Patient Instructions  Will arrange for home sleep study Will call to arrange for follow up after sleep study reviewed     Chesley Mires, M.D. Pager [phone] 03/16/2017, 9:53 AM

## 2017-03-16 NOTE — Progress Notes (Signed)
   Subjective:    Patient ID: Paul Carson, male    DOB: 01-16-1966, 51 y.o.   MRN: [id number]  HPI    Review of Systems  Constitutional: Negative for fever and unexpected weight change.  HENT: Negative for congestion, dental problem, ear pain, nosebleeds, postnasal drip, rhinorrhea, sinus pressure, sneezing, sore throat and trouble swallowing.   Eyes: Negative for redness and itching.  Respiratory: Negative for cough, chest tightness, shortness of breath and wheezing.   Cardiovascular: Negative for palpitations and leg swelling.  Gastrointestinal: Negative for nausea and vomiting.  Genitourinary: Negative for dysuria.  Musculoskeletal: Negative for joint swelling.  Skin: Negative for rash.  Allergic/Immunologic: Positive for environmental allergies. Negative for food allergies and immunocompromised state.  Neurological: Negative for headaches.  Hematological: Does not bruise/bleed easily.  Psychiatric/Behavioral: Negative for dysphoric mood. The patient is nervous/anxious.        Objective:   Physical Exam        Assessment & Plan:

## 2017-04-06 ENCOUNTER — Encounter: Payer: Self-pay | Admitting: Gastroenterology

## 2017-04-07 ENCOUNTER — Ambulatory Visit (AMBULATORY_SURGERY_CENTER): Payer: Self-pay | Admitting: *Deleted

## 2017-04-07 ENCOUNTER — Other Ambulatory Visit: Payer: Self-pay

## 2017-04-07 VITALS — Ht 67.0 in | Wt 180.0 lb

## 2017-04-07 DIAGNOSIS — Z1211 Encounter for screening for malignant neoplasm of colon: Secondary | ICD-10-CM

## 2017-04-07 MED ORDER — NA SULFATE-K SULFATE-MG SULF 17.5-3.13-1.6 GM/177ML PO SOLN: 1.0000 | Freq: Once | ORAL | 0 refills | Status: AC

## 2017-04-07 NOTE — Progress Notes (Signed)
No egg or soy allergy known to patient  No issues with past sedation with any surgeries  or procedures, no intubation problems  No diet pills per patient No home 02 use per patient  No blood thinners per patient  Pt denies issues with constipation  No A fib or A flutter  EMMI video sent to pt's e mail - pt declined  On line $15 coupon for suprep to pt today in [facility]

## 2017-04-21 ENCOUNTER — Encounter: Payer: Self-pay | Admitting: Gastroenterology

## 2017-04-21 ENCOUNTER — Ambulatory Visit (AMBULATORY_SURGERY_CENTER): Payer: Federal, State, Local not specified - PPO | Admitting: Gastroenterology

## 2017-04-21 ENCOUNTER — Other Ambulatory Visit: Payer: Self-pay

## 2017-04-21 VITALS — BP 119/68 | HR 55 | Temp 98.0°F | Resp 20 | Ht 67.0 in | Wt 180.0 lb

## 2017-04-21 DIAGNOSIS — D128 Benign neoplasm of rectum: Secondary | ICD-10-CM

## 2017-04-21 DIAGNOSIS — Z1211 Encounter for screening for malignant neoplasm of colon: Secondary | ICD-10-CM | POA: Diagnosis present

## 2017-04-21 DIAGNOSIS — D124 Benign neoplasm of descending colon: Secondary | ICD-10-CM

## 2017-04-21 DIAGNOSIS — Z1212 Encounter for screening for malignant neoplasm of rectum: Secondary | ICD-10-CM

## 2017-04-21 DIAGNOSIS — D123 Benign neoplasm of transverse colon: Secondary | ICD-10-CM | POA: Diagnosis not present

## 2017-04-21 HISTORY — PX: COLONOSCOPY: SHX174

## 2017-04-21 MED ORDER — SODIUM CHLORIDE 0.9 % IV SOLN: 500.0000 mL | INTRAVENOUS | Status: DC

## 2017-04-21 NOTE — Op Note (Signed)
[facility] Patient Name: Paul Carson Procedure Date: 04/21/2017 11:18 AM MRN: [id number] Endoscopist: Mauri Pole , MD Age: 52 Referring MD:  Date of Birth: 01-Feb-1966 Gender: Male Account #: [id number] Procedure:                Colonoscopy Indications:              Screening for colorectal malignant neoplasm, This                            is the patient's first colonoscopy Medicines:                Monitored Anesthesia Care Procedure:                Pre-Anesthesia Assessment:                           - Prior to the procedure, a History and Physical                            was performed, and patient medications and                            allergies were reviewed. The patient's tolerance of                            previous anesthesia was also reviewed. The risks                            and benefits of the procedure and the sedation                            options and risks were discussed with the patient.                            All questions were answered, and informed consent                            was obtained. Prior Anticoagulants: The patient has                            taken no previous anticoagulant or antiplatelet                            agents. ASA Grade Assessment: II - A patient with                            mild systemic disease. After reviewing the risks                            and benefits, the patient was deemed in                            satisfactory condition to undergo the procedure.  After obtaining informed consent, the colonoscope                            was passed under direct vision. Throughout the                            procedure, the patient's blood pressure, pulse, and                            oxygen saturations were monitored continuously. The                            Colonoscope was introduced through the anus and                            advanced to the the  cecum, identified by                            appendiceal orifice and ileocecal valve. The                            colonoscopy was performed without difficulty. The                            patient tolerated the procedure well. The quality                            of the bowel preparation was good. The ileocecal                            valve, appendiceal orifice, and rectum were                            photographed. Scope In: 11:19:17 AM Scope Out: 11:39:41 AM Scope Withdrawal Time: 0 hours 17 minutes 56 seconds  Total Procedure Duration: 0 hours 20 minutes 24 seconds  Findings:                 The perianal and digital rectal examinations were                            normal.                           Two sessile polyps were found in the transverse                            colon. The polyps were 1 to 2 mm in size. These                            polyps were removed with a cold biopsy forceps.                            Resection and retrieval were complete.  Three sessile polyps were found in the rectum X1                            and descending colon X2. The polyps were 4 to 8 mm                            in size. These polyps were removed with a cold                            snare. Resection and retrieval were complete.                           A 18 mm polyp was found in the rectum. The polyp                            was pedunculated. The polyp was removed with a hot                            snare. Resection and retrieval were complete.                           A few small-mouthed diverticula were found in the                            sigmoid colon and descending colon.                           Non-bleeding internal hemorrhoids were found during                            retroflexion. The hemorrhoids were small. Complications:            No immediate complications. Estimated Blood Loss:     Estimated blood loss was  minimal. Impression:               - Two 1 to 2 mm polyps in the transverse colon,                            removed with a cold biopsy forceps. Resected and                            retrieved.                           - Three 4 to 8 mm polyps in the rectum and in the                            descending colon, removed with a cold snare.                            Resected and retrieved.                           - One  18 mm polyp in the rectum, removed with a hot                            snare. Resected and retrieved.                           - Diverticulosis in the sigmoid colon and in the                            descending colon.                           - Non-bleeding internal hemorrhoids. Recommendation:           - Patient has a contact number available for                            emergencies. The signs and symptoms of potential                            delayed complications were discussed with the                            patient. Return to normal activities tomorrow.                            Written discharge instructions were provided to the                            patient.                           - Resume previous diet.                           - Continue present medications.                           - Await pathology results.                           - Repeat colonoscopy in 3 years for surveillance                            based on pathology results.                           - No aspirin, ibuprofen, naproxen, or other                            non-steroidal anti-inflammatory drugs for 2 weeks. Mauri Pole, MD 04/21/2017 11:44:05 AM This report has been signed electronically.

## 2017-04-21 NOTE — Progress Notes (Signed)
Called to room to assist during endoscopic procedure.  Patient ID and intended procedure confirmed with present staff. Received instructions for my participation in the procedure from the performing physician.  

## 2017-04-21 NOTE — Progress Notes (Signed)
Report to PACU, RN, vss, BBS= Clear.  

## 2017-04-21 NOTE — Patient Instructions (Signed)
YOU HAD AN ENDOSCOPIC PROCEDURE TODAY AT [facility]:   Refer to the procedure report that was given to you for any specific questions about what was found during the examination.  If the procedure report does not answer your questions, please call your gastroenterologist to clarify.  If you requested that your care partner not be given the details of your procedure findings, then the procedure report has been included in a sealed envelope for you to review at your convenience later.  YOU SHOULD EXPECT: Some feelings of bloating in the abdomen. Passage of more gas than usual.  Walking can help get rid of the air that was put into your GI tract during the procedure and reduce the bloating. If you had a lower endoscopy (such as a colonoscopy or flexible sigmoidoscopy) you may notice spotting of blood in your stool or on the toilet paper. If you underwent a bowel prep for your procedure, you may not have a normal bowel movement for a few days.  Please Note:  You might notice some irritation and congestion in your nose or some drainage.  This is from the oxygen used during your procedure.  There is no need for concern and it should clear up in a day or so.  SYMPTOMS TO REPORT IMMEDIATELY:   Following lower endoscopy (colonoscopy or flexible sigmoidoscopy):  Excessive amounts of blood in the stool  Significant tenderness or worsening of abdominal pains  Swelling of the abdomen that is new, acute  Fever of 100F or higher   For urgent or emergent issues, a gastroenterologist can be reached at any hour by calling [phone].   DIET:  We do recommend a small meal at first, but then you may proceed to your regular diet.  Drink plenty of fluids but you should avoid alcoholic beverages for 24 hours.  ACTIVITY:  You should plan to take it easy for the rest of today and you should NOT DRIVE or use heavy machinery until tomorrow (because of the sedation medicines used during the test).     FOLLOW UP: Our staff will call the number listed on your records the next business day following your procedure to check on you and address any questions or concerns that you may have regarding the information given to you following your procedure. If we do not reach you, we will leave a message.  However, if you are feeling well and you are not experiencing any problems, there is no need to return our call.  We will assume that you have returned to your regular daily activities without incident.  If any biopsies were taken you will be contacted by phone or by letter within the next 1-3 weeks.  Please call us at [phone] if you have not heard about the biopsies in 3 weeks.    SIGNATURES/CONFIDENTIALITY: You and/or your care partner have signed paperwork which will be entered into your electronic medical record.  These signatures attest to the fact that that the information above on your After Visit Summary has been reviewed and is understood.  Full responsibility of the confidentiality of this discharge information lies with you and/or your care-partner.  No aspirin, ibuprofen, naproxen or other non steroidal anti-inflammatory medications for 2 weeks.  Polyp, diverticulosis, and hemorrhoid information given.

## 2017-04-24 ENCOUNTER — Telehealth: Payer: Self-pay

## 2017-04-24 NOTE — Telephone Encounter (Signed)
  Follow up Call-  Call back number 04/21/2017  Post procedure Call Back phone  # [phone]  Permission to leave phone message Yes  Some recent data might be hidden     Patient questions:  Do you have a fever, pain , or abdominal swelling? No. Pain Score  0 *  Have you tolerated food without any problems? Yes.    Have you been able to return to your normal activities? Yes.    Do you have any questions about your discharge instructions: Diet   No. Medications  No. Follow up visit  No.  Do you have questions or concerns about your Care? No.  Actions: * If pain score is 4 or above: No action needed, pain <4.

## 2017-04-26 ENCOUNTER — Encounter: Payer: Self-pay | Admitting: Gastroenterology

## 2017-04-29 ENCOUNTER — Encounter: Payer: Self-pay | Admitting: Physician Assistant

## 2017-04-29 DIAGNOSIS — D126 Benign neoplasm of colon, unspecified: Secondary | ICD-10-CM | POA: Insufficient documentation

## 2017-05-26 ENCOUNTER — Telehealth: Payer: Self-pay | Admitting: Pulmonary Disease

## 2017-05-26 NOTE — Telephone Encounter (Signed)
[facility] has reprinted this and will give to Rodena Piety to set pt up

## 2017-06-02 DIAGNOSIS — G4733 Obstructive sleep apnea (adult) (pediatric): Secondary | ICD-10-CM | POA: Diagnosis not present

## 2017-06-03 DIAGNOSIS — G4733 Obstructive sleep apnea (adult) (pediatric): Secondary | ICD-10-CM | POA: Diagnosis not present

## 2017-06-06 ENCOUNTER — Encounter: Payer: Self-pay | Admitting: Pulmonary Disease

## 2017-06-06 ENCOUNTER — Other Ambulatory Visit: Payer: Self-pay | Admitting: *Deleted

## 2017-06-06 ENCOUNTER — Telehealth: Payer: Self-pay | Admitting: Pulmonary Disease

## 2017-06-06 DIAGNOSIS — G4733 Obstructive sleep apnea (adult) (pediatric): Secondary | ICD-10-CM

## 2017-06-06 HISTORY — DX: Obstructive sleep apnea (adult) (pediatric): G47.33

## 2017-06-06 NOTE — Telephone Encounter (Signed)
HST 06/02/17 >> AHI 12.5, SaO2 low 81%.   Will have my nurse inform pt that sleep study shows mild sleep apnea.  Options are 1) CPAP now, 2) ROV first.  If He is agreeable to CPAP, then please send order for auto CPAP range 5 to 15 cm H2O with heated humidity and mask of choice.  Have download sent 1 month after starting CPAP and set up ROV 2 months after starting CPAP.  ROV can be with me or NP.

## 2017-06-07 NOTE — Telephone Encounter (Signed)
Called and spoke with patient today regarding results and recommendations.  Informed the patient of results today. The patient verbalized understanding and denied any questions or concerns at this time.   Pt requests to place order for  auto CPAP range 5 to 15 cm H2O with heated humidity and mask of choice.   Have download sent 1 month after starting CPAP and set up ROV 2 months after starting CPAP.   Nothing further needed.

## 2017-07-04 ENCOUNTER — Encounter: Payer: Self-pay | Admitting: Physician Assistant

## 2017-07-04 ENCOUNTER — Ambulatory Visit: Payer: Federal, State, Local not specified - PPO | Admitting: Physician Assistant

## 2017-07-04 ENCOUNTER — Other Ambulatory Visit: Payer: Self-pay

## 2017-07-04 VITALS — BP 108/88 | HR 70 | Temp 98.5°F | Resp 16 | Ht 67.0 in | Wt 177.2 lb

## 2017-07-04 DIAGNOSIS — G4733 Obstructive sleep apnea (adult) (pediatric): Secondary | ICD-10-CM | POA: Diagnosis not present

## 2017-07-04 DIAGNOSIS — I10 Essential (primary) hypertension: Secondary | ICD-10-CM

## 2017-07-04 DIAGNOSIS — E789 Disorder of lipoprotein metabolism, unspecified: Secondary | ICD-10-CM

## 2017-07-04 LAB — CBC WITH DIFFERENTIAL/PLATELET
BASOS: 0 %
Basophils Absolute: 0 10*3/uL (ref 0.0–0.2)
EOS (ABSOLUTE): 0.1 10*3/uL (ref 0.0–0.4)
EOS: 2 %
HEMATOCRIT: 44 % (ref 37.5–51.0)
HEMOGLOBIN: 15.4 g/dL (ref 13.0–17.7)
Immature Grans (Abs): 0 10*3/uL (ref 0.0–0.1)
Immature Granulocytes: 0 %
LYMPHS ABS: 1 10*3/uL (ref 0.7–3.1)
Lymphs: 21 %
MCH: 29.7 pg (ref 26.6–33.0)
MCHC: 35 g/dL (ref 31.5–35.7)
MCV: 85 fL (ref 79–97)
MONOCYTES: 12 %
MONOS ABS: 0.6 10*3/uL (ref 0.1–0.9)
Neutrophils Absolute: 3 10*3/uL (ref 1.4–7.0)
Neutrophils: 65 %
Platelets: 345 10*3/uL (ref 150–379)
RBC: 5.19 x10E6/uL (ref 4.14–5.80)
RDW: 13.7 % (ref 12.3–15.4)
WBC: 4.7 10*3/uL (ref 3.4–10.8)

## 2017-07-04 LAB — COMPREHENSIVE METABOLIC PANEL
A/G RATIO: 1.6 (ref 1.2–2.2)
ALBUMIN: 4.1 g/dL (ref 3.5–5.5)
ALK PHOS: 51 IU/L (ref 39–117)
ALT: 23 IU/L (ref 0–44)
AST: 17 IU/L (ref 0–40)
BUN / CREAT RATIO: 13 (ref 9–20)
BUN: 13 mg/dL (ref 6–24)
Bilirubin Total: 0.7 mg/dL (ref 0.0–1.2)
CO2: 21 mmol/L (ref 20–29)
CREATININE: 0.97 mg/dL (ref 0.76–1.27)
Calcium: 8.9 mg/dL (ref 8.7–10.2)
Chloride: 106 mmol/L (ref 96–106)
GFR calc Af Amer: 104 mL/min/{1.73_m2} (ref >59)
GFR, EST NON AFRICAN AMERICAN: 90 mL/min/{1.73_m2} (ref >59)
GLOBULIN, TOTAL: 2.5 g/dL (ref 1.5–4.5)
Glucose: 94 mg/dL (ref 65–99)
Potassium: 4.5 mmol/L (ref 3.5–5.2)
SODIUM: 142 mmol/L (ref 134–144)
Total Protein: 6.6 g/dL (ref 6.0–8.5)

## 2017-07-04 LAB — LIPID PANEL
CHOL/HDL RATIO: 3.4 ratio (ref 0.0–5.0)
Cholesterol, Total: 148 mg/dL (ref 100–199)
HDL: 43 mg/dL (ref >39)
LDL CALC: 84 mg/dL (ref 0–99)
Triglycerides: 107 mg/dL (ref 0–149)
VLDL Cholesterol Cal: 21 mg/dL (ref 5–40)

## 2017-07-04 NOTE — Assessment & Plan Note (Signed)
Await lab results. Continue atorvastatin.

## 2017-07-04 NOTE — Assessment & Plan Note (Signed)
>>  ASSESSMENT AND PLAN FOR LIPID DISORDER WRITTEN ON 07/04/2017  9:25 AM BY JEFFERY, CHELLE, PA-C  Await lab results. Continue atorvastatin.

## 2017-07-04 NOTE — Assessment & Plan Note (Signed)
Very well controlled. Continue lisinopril.

## 2017-07-04 NOTE — Progress Notes (Signed)
Patient ID: Paul Carson, male    DOB: August 02, 1965, 52 y.o.   MRN: [id number]  PCP: Harrison Mons, PA-C  Chief Complaint  Patient presents with  . Hypertension    follow up   . Hyperlipidemia    follow up     Subjective:   Presents for evaluation of HTN and hyperlipidemia.  No problems or concerns. Feels good. Tolerating medications well, no adverse effects.  Last visit LDL was 100.   Review of Systems  Constitutional: Negative for chills and fever.  Respiratory: Negative for cough and shortness of breath.   Cardiovascular: Negative for chest pain, palpitations and leg swelling.  Gastrointestinal: Negative for diarrhea, nausea and vomiting.  Endocrine: Negative for polydipsia.  Genitourinary: Negative for dysuria, frequency and urgency.  Musculoskeletal: Negative for myalgias.  Skin: Negative for rash.  Neurological: Negative for dizziness and headaches.       Patient Active Problem List   Diagnosis Date Noted  . OSA (obstructive sleep apnea) 06/06/2017  . Adenomatous colon polyp 04/29/2017  . Obsessive-compulsive disorder 01/07/2017  . Gout 03/30/2012  . HTN (hypertension) 03/27/2012  . Lipid disorder 03/27/2012     Prior to Admission medications   Medication Sig Start Date End Date Taking? Authorizing Provider  atorvastatin (LIPITOR) 20 MG tablet Take 1 tablet (20 mg total) by mouth daily. 01/03/17  Yes Quinnton Bury, PA-C  lisinopril (PRINIVIL,ZESTRIL) 5 MG tablet Take 1 tablet (5 mg total) by mouth daily. 01/03/17  Yes Taniqua Issa, PA-C  Melatonin 5 MG TABS Take by mouth at bedtime as needed.   Yes [provider]  PARoxetine (PAXIL) 30 MG tablet Take 1 tablet (30 mg total) by mouth daily. 01/03/17  Yes Markos Theil, PA-C     No Known Allergies     Objective:  Physical Exam  Constitutional: He is oriented to person, place, and time. He appears well-developed and well-nourished. He is active and cooperative. No distress.  BP  108/88   Pulse 70   Temp 98.5 F (36.9 C)   Resp 16   Ht 5\' 7"  (1.702 m)   Wt 177 lb 3.2 oz (80.4 kg)   SpO2 96%   BMI 27.75 kg/m   HENT:  Head: Normocephalic and atraumatic.  Right Ear: Hearing normal.  Left Ear: Hearing normal.  Eyes: Conjunctivae are normal. No scleral icterus.  Neck: Normal range of motion. Neck supple. No thyromegaly present.  Cardiovascular: Normal rate, regular rhythm and normal heart sounds.  Pulses:      Radial pulses are 2+ on the right side, and 2+ on the left side.  Pulmonary/Chest: Effort normal and breath sounds normal.  Lymphadenopathy:       Head (right side): No tonsillar, no preauricular, no posterior auricular and no occipital adenopathy present.       Head (left side): No tonsillar, no preauricular, no posterior auricular and no occipital adenopathy present.    He has no cervical adenopathy.       Right: No supraclavicular adenopathy present.       Left: No supraclavicular adenopathy present.  Neurological: He is alert and oriented to person, place, and time. No sensory deficit.  Skin: Skin is warm, dry and intact. No rash noted. No cyanosis or erythema. Nails show no clubbing.  Psychiatric: He has a normal mood and affect. His speech is normal and behavior is normal.   Wt Readings from Last 3 Encounters:  07/04/17 177 lb 3.2 oz (80.4 kg)  04/21/17 180  lb (81.6 kg)  04/07/17 180 lb (81.6 kg)           Assessment & Plan:   Problem List Items Addressed This Visit    HTN (hypertension) - Primary    Very well controlled. Continue lisinopril.      Relevant Orders   CBC with Differential/Platelet   Comprehensive metabolic panel   Lipid disorder    Await lab results. Continue atorvastatin.      Relevant Orders   Comprehensive metabolic panel   Lipid panel       Return in about 6 months (around 01/04/2018) for re-evaluation of cholesterol and blood pressure.   Fara Chute, PA-C [facility]

## 2017-07-04 NOTE — Patient Instructions (Signed)
     IF you received an x-ray today, you will receive an invoice from [facility]. Please contact [facility] at [phone] with questions or concerns regarding your invoice.   IF you received labwork today, you will receive an invoice from [facility]. Please contact [facility] at [phone] with questions or concerns regarding your invoice.   Our billing staff will not be able to assist you with questions regarding bills from these companies.  You will be contacted with the lab results as soon as they are available. The fastest way to get your results is to activate your My Chart account. Instructions are located on the last page of this paperwork. If you have not heard from us regarding the results in 2 weeks, please contact this office.     

## 2017-07-12 DIAGNOSIS — M10072 Idiopathic gout, left ankle and foot: Secondary | ICD-10-CM | POA: Diagnosis not present

## 2017-07-17 ENCOUNTER — Encounter: Payer: Self-pay | Admitting: Physician Assistant

## 2017-08-04 DIAGNOSIS — G4733 Obstructive sleep apnea (adult) (pediatric): Secondary | ICD-10-CM | POA: Diagnosis not present

## 2017-09-03 DIAGNOSIS — G4733 Obstructive sleep apnea (adult) (pediatric): Secondary | ICD-10-CM | POA: Diagnosis not present

## 2017-10-04 DIAGNOSIS — G4733 Obstructive sleep apnea (adult) (pediatric): Secondary | ICD-10-CM | POA: Diagnosis not present

## 2017-12-16 DIAGNOSIS — J029 Acute pharyngitis, unspecified: Secondary | ICD-10-CM | POA: Diagnosis not present

## 2017-12-16 DIAGNOSIS — J019 Acute sinusitis, unspecified: Secondary | ICD-10-CM | POA: Diagnosis not present

## 2018-01-03 ENCOUNTER — Other Ambulatory Visit: Payer: Self-pay | Admitting: Emergency Medicine

## 2018-01-03 DIAGNOSIS — I10 Essential (primary) hypertension: Secondary | ICD-10-CM

## 2018-01-03 DIAGNOSIS — F429 Obsessive-compulsive disorder, unspecified: Secondary | ICD-10-CM

## 2018-01-04 ENCOUNTER — Ambulatory Visit: Payer: Federal, State, Local not specified - PPO | Admitting: Family Medicine

## 2018-01-16 ENCOUNTER — Ambulatory Visit: Payer: Federal, State, Local not specified - PPO | Admitting: Emergency Medicine

## 2018-01-16 NOTE — Progress Notes (Signed)
Subjective:    Patient ID: Paul Carson, male    DOB: [birth year], 52 y.o.   MRN: [id number]  HPI:  Mr. Gloss is here to establish as a new pt. He is pleasant 52 year old male. PMH: HTN, HLD, OCD, Insomnia, and newly dx'd OSA- he is inconsistent with CPAP use He reports medication compliance, denies SE He reports >15 lbs wt loss over the last year r/t reduction in Pepsi and fast food intake. He denies regular exercise He denies plain water intake, hydrates instead with 2-3 Pepsis, 2 Sweat tea's/day He denies tobacco/ETOH use He reports OCD sx's stable on Paroxetine 30mg  QD  Patient Care Team    Relationship Specialty Notifications Start End  [name], NP PCP - General Family Medicine  01/16/18     Patient Active Problem List   Diagnosis Date Noted  . Healthcare maintenance 01/17/2018  . Insomnia 01/17/2018  . OSA (obstructive sleep apnea) 06/06/2017  . Adenomatous colon polyp 04/29/2017  . Obsessive-compulsive disorder 01/07/2017  . Gout 03/30/2012  . HTN (hypertension) 03/27/2012  . Lipid disorder 03/27/2012     Past Medical History:  Diagnosis Date  . Allergy    seasonal  . Anxiety   . Heart murmur   . Hyperlipidemia   . Hypertension   . OCD (obsessive compulsive disorder)   . OSA (obstructive sleep apnea) 06/06/2017     Past Surgical History:  Procedure Laterality Date  . carpal tunel  2012   left wrist   . CARPAL TUNNEL RELEASE  09/2010   left wrist  . VASECTOMY       Family History  Problem Relation Age of Onset  . Hypertension Father   . Healthy Mother   . Healthy Brother   . Healthy Daughter   . Healthy Son   . Colon cancer Neg Hx   . Colon polyps Neg Hx   . Rectal cancer Neg Hx   . Stomach cancer Neg Hx      Social History   Substance and Sexual Activity  Drug Use No     Social History   Substance and Sexual Activity  Alcohol Use No  . Alcohol/week: 0.0 standard drinks     Social History   Tobacco Use  Smoking  Status Former Smoker  . Packs/day: 0.50  . Years: 10.00  . Pack years: 5.00  . Types: Cigarettes  . Last attempt to quit: 06/29/1998  . Years since quitting: 19.5  Smokeless Tobacco Never Used     Outpatient Encounter Medications as of 01/17/2018  Medication Sig  . atorvastatin (LIPITOR) 20 MG tablet Take 1 tablet (20 mg total) by mouth daily.  Marland Kitchen lisinopril (PRINIVIL,ZESTRIL) 5 MG tablet TAKE 1 TABLET BY MOUTH EVERY DAY  . Melatonin 5 MG TABS Take by mouth at bedtime as needed.  Marland Kitchen PARoxetine (PAXIL) 30 MG tablet TAKE 1 TABLET BY MOUTH EVERY DAY  . [DISCONTINUED] atorvastatin (LIPITOR) 20 MG tablet Take 1 tablet (20 mg total) by mouth daily.   Facility-Administered Encounter Medications as of 01/17/2018  Medication  . 0.9 %  sodium chloride infusion    Allergies: Patient has no known allergies.  Body mass index is 27.36 kg/m.  Blood pressure 118/76, pulse 61, height 5\' 7"  (1.702 m), weight 174 lb 11.2 oz (79.2 kg), SpO2 96 %.  Review of Systems  Constitutional: Positive for fatigue. Negative for activity change, appetite change, chills, diaphoresis, fever and unexpected weight change.  Eyes: Negative for visual disturbance.  Respiratory: Negative for cough, chest tightness, shortness of breath, wheezing and stridor.   Cardiovascular: Negative for chest pain, palpitations and leg swelling.  Gastrointestinal: Negative for abdominal distention, abdominal pain, blood in stool, constipation, diarrhea, nausea and vomiting.  Endocrine: Negative for cold intolerance, heat intolerance, polydipsia, polyphagia and polyuria.  Genitourinary: Negative for difficulty urinating and flank pain.  Musculoskeletal: Negative for arthralgias, back pain, gait problem, joint swelling, myalgias, neck pain and neck stiffness.  Skin: Negative for pallor, rash and wound.  Neurological: Negative for dizziness and headaches.  Hematological: Does not bruise/bleed easily.  Psychiatric/Behavioral: Positive  for sleep disturbance. Negative for behavioral problems, confusion, decreased concentration, dysphoric mood, hallucinations, self-injury and suicidal ideas. The patient is not nervous/anxious and is not hyperactive.        Objective:   Physical Exam  Constitutional: He is oriented to person, place, and time. He appears well-developed and well-nourished. No distress.  HENT:  Head: Normocephalic and atraumatic.  Right Ear: External ear normal.  Left Ear: External ear normal.  Nose: Nose normal.  Mouth/Throat: Oropharynx is clear and moist.  Eyes: Conjunctivae are normal.  Cardiovascular: Normal rate, regular rhythm, normal heart sounds and intact distal pulses.  No murmur heard. Pulmonary/Chest: Effort normal and breath sounds normal. No stridor. No respiratory distress. He has no wheezes. He has no rales. He exhibits no tenderness.  Neurological: He is alert and oriented to person, place, and time.  Skin: Skin is warm and dry. Capillary refill takes less than 2 seconds. No rash noted. He is not diaphoretic. No erythema. No pallor.  Psychiatric: He has a normal mood and affect. His behavior is normal. Judgment and thought content normal.  Nursing note and vitals reviewed.     Assessment & Plan:   1. Healthcare maintenance   2. Lipid disorder   3. Essential hypertension   4. Insomnia, unspecified type   5. Obsessive-compulsive disorder, unspecified type     Healthcare maintenance Continue all medications as directed. Increase water intake, strive for at least 75 ounces/day.   Follow Heart Healthy diet Increase regular exercise.  Recommend at least 30 minutes daily, 5 days per week of walking, jogging, biking, swimming, YouTube/Pinterest workout videos. Atorvastatin refilled, please call pharmacy when you need additional refills. We will call you when lab results are available. Recommend consistent use of CPAP, follow-up with Sleep Medicine as directed. Recommend complete physical  this fall.  HTN (hypertension) BP at goal 118/76, HR 61 Continue Lisinopril 5mg  QD   Lipid disorder Lipids drawn today Currently on Atorvastatin 20mg  QD Continue to reduce saturated fat from diet   Obsessive-compulsive disorder Stable on Paroxetine 30mg      FOLLOW-UP:  Return in about 8 weeks (around 03/14/2018) for CPE.

## 2018-01-17 ENCOUNTER — Ambulatory Visit: Payer: Federal, State, Local not specified - PPO | Admitting: Adult Health

## 2018-01-17 ENCOUNTER — Encounter: Payer: Self-pay | Admitting: Adult Health

## 2018-01-17 VITALS — BP 118/76 | HR 61 | Ht 67.0 in | Wt 174.7 lb

## 2018-01-17 DIAGNOSIS — G4700 Insomnia, unspecified: Secondary | ICD-10-CM | POA: Diagnosis not present

## 2018-01-17 DIAGNOSIS — Z0000 Encounter for general adult medical examination without abnormal findings: Secondary | ICD-10-CM | POA: Insufficient documentation

## 2018-01-17 DIAGNOSIS — I10 Essential (primary) hypertension: Secondary | ICD-10-CM | POA: Diagnosis not present

## 2018-01-17 DIAGNOSIS — E789 Disorder of lipoprotein metabolism, unspecified: Secondary | ICD-10-CM

## 2018-01-17 DIAGNOSIS — F429 Obsessive-compulsive disorder, unspecified: Secondary | ICD-10-CM

## 2018-01-17 MED ORDER — ATORVASTATIN CALCIUM 20 MG PO TABS: 20.0000 mg | ORAL_TABLET | Freq: Every day | ORAL | 3 refills | Status: DC

## 2018-01-17 NOTE — Assessment & Plan Note (Signed)
Lipids drawn today Currently on Atorvastatin 20mg  QD Continue to reduce saturated fat from diet

## 2018-01-17 NOTE — Patient Instructions (Signed)
Hypertension Hypertension, commonly called high blood pressure, is when the force of blood pumping through the arteries is too strong. The arteries are the blood vessels that carry blood from the heart throughout the body. Hypertension forces the heart to work harder to pump blood and may cause arteries to become narrow or stiff. Having untreated or uncontrolled hypertension can cause heart attacks, strokes, kidney disease, and other problems. A blood pressure reading consists of a higher number over a lower number. Ideally, your blood pressure should be below 120/80. The first ("top") number is called the systolic pressure. It is a measure of the pressure in your arteries as your heart beats. The second ("bottom") number is called the diastolic pressure. It is a measure of the pressure in your arteries as the heart relaxes. What are the causes? The cause of this condition is not known. What increases the risk? Some risk factors for high blood pressure are under your control. Others are not. Factors you can change  Smoking.  Having type 2 diabetes mellitus, high cholesterol, or both.  Not getting enough exercise or physical activity.  Being overweight.  Having too much fat, sugar, calories, or salt (sodium) in your diet.  Drinking too much alcohol. Factors that are difficult or impossible to change  Having chronic kidney disease.  Having a family history of high blood pressure.  Age. Risk increases with age.  Race. You may be at higher risk if you are African-American.  Gender. Men are at higher risk than women before age 45. After age 65, women are at higher risk than men.  Having obstructive sleep apnea.  Stress. What are the signs or symptoms? Extremely high blood pressure (hypertensive crisis) may cause:  Headache.  Anxiety.  Shortness of breath.  Nosebleed.  Nausea and vomiting.  Severe chest pain.  Jerky movements you cannot control (seizures).  How is this  diagnosed? This condition is diagnosed by measuring your blood pressure while you are seated, with your arm resting on a surface. The cuff of the blood pressure monitor will be placed directly against the skin of your upper arm at the level of your heart. It should be measured at least twice using the same arm. Certain conditions can cause a difference in blood pressure between your right and left arms. Certain factors can cause blood pressure readings to be lower or higher than normal (elevated) for a short period of time:  When your blood pressure is higher when you are in a health care provider's office than when you are at home, this is called white coat hypertension. Most people with this condition do not need medicines.  When your blood pressure is higher at home than when you are in a health care provider's office, this is called masked hypertension. Most people with this condition may need medicines to control blood pressure.  If you have a high blood pressure reading during one visit or you have normal blood pressure with other risk factors:  You may be asked to return on a different day to have your blood pressure checked again.  You may be asked to monitor your blood pressure at home for 1 week or longer.  If you are diagnosed with hypertension, you may have other blood or imaging tests to help your health care provider understand your overall risk for other conditions. How is this treated? This condition is treated by making healthy lifestyle changes, such as eating healthy foods, exercising more, and reducing your alcohol intake. Your   health care provider may prescribe medicine if lifestyle changes are not enough to get your blood pressure under control, and if:  Your systolic blood pressure is above 130.  Your diastolic blood pressure is above 80.  Your personal target blood pressure may vary depending on your medical conditions, your age, and other factors. Follow these  instructions at home: Eating and drinking  Eat a diet that is high in fiber and potassium, and low in sodium, added sugar, and fat. An example eating plan is called the DASH (Dietary Approaches to Stop Hypertension) diet. To eat this way: ? Eat plenty of fresh fruits and vegetables. Try to fill half of your plate at each meal with fruits and vegetables. ? Eat whole grains, such as whole wheat pasta, brown rice, or whole grain bread. Fill about one quarter of your plate with whole grains. ? Eat or drink low-fat dairy products, such as skim milk or low-fat yogurt. ? Avoid fatty cuts of meat, processed or cured meats, and poultry with skin. Fill about one quarter of your plate with lean proteins, such as fish, chicken without skin, beans, eggs, and tofu. ? Avoid premade and processed foods. These tend to be higher in sodium, added sugar, and fat.  Reduce your daily sodium intake. Most people with hypertension should eat less than 1,500 mg of sodium a day.  Limit alcohol intake to no more than 1 drink a day for nonpregnant women and 2 drinks a day for men. One drink equals 12 oz of beer, 5 oz of wine, or 1 oz of hard liquor. Lifestyle  Work with your health care provider to maintain a healthy body weight or to lose weight. Ask what an ideal weight is for you.  Get at least 30 minutes of exercise that causes your heart to beat faster (aerobic exercise) most days of the week. Activities may include walking, swimming, or biking.  Include exercise to strengthen your muscles (resistance exercise), such as pilates or lifting weights, as part of your weekly exercise routine. Try to do these types of exercises for 30 minutes at least 3 days a week.  Do not use any products that contain nicotine or tobacco, such as cigarettes and e-cigarettes. If you need help quitting, ask your health care provider.  Monitor your blood pressure at home as told by your health care provider.  Keep all follow-up visits as  told by your health care provider. This is important. Medicines  Take over-the-counter and prescription medicines only as told by your health care provider. Follow directions carefully. Blood pressure medicines must be taken as prescribed.  Do not skip doses of blood pressure medicine. Doing this puts you at risk for problems and can make the medicine less effective.  Ask your health care provider about side effects or reactions to medicines that you should watch for. Contact a health care provider if:  You think you are having a reaction to a medicine you are taking.  You have headaches that keep coming back (recurring).  You feel dizzy.  You have swelling in your ankles.  You have trouble with your vision. Get help right away if:  You develop a severe headache or confusion.  You have unusual weakness or numbness.  You feel faint.  You have severe pain in your chest or abdomen.  You vomit repeatedly.  You have trouble breathing. Summary  Hypertension is when the force of blood pumping through your arteries is too strong. If this condition is not   controlled, it may put you at risk for serious complications.  Your personal target blood pressure may vary depending on your medical conditions, your age, and other factors. For most people, a normal blood pressure is less than 120/80.  Hypertension is treated with lifestyle changes, medicines, or a combination of both. Lifestyle changes include weight loss, eating a healthy, low-sodium diet, exercising more, and limiting alcohol. This information is not intended to replace advice given to you by your health care provider. Make sure you discuss any questions you have with your health care provider. Document Released: 03/28/2005 Document Revised: 02/24/2016 Document Reviewed: 02/24/2016 Elsevier Interactive Patient Education  2018 [employer].  Insomnia Insomnia is a sleep disorder that makes it difficult to fall asleep or to  stay asleep. Insomnia can cause tiredness (fatigue), low energy, difficulty concentrating, mood swings, and poor performance at work or school. There are three different ways to classify insomnia:  Difficulty falling asleep.  Difficulty staying asleep.  Waking up too early in the morning.  Any type of insomnia can be long-term (chronic) or short-term (acute). Both are common. Short-term insomnia usually lasts for three months or less. Chronic insomnia occurs at least three times a week for longer than three months. What are the causes? Insomnia may be caused by another condition, situation, or substance, such as:  Anxiety.  Certain medicines.  Gastroesophageal reflux disease (GERD) or other gastrointestinal conditions.  Asthma or other breathing conditions.  Restless legs syndrome, sleep apnea, or other sleep disorders.  Chronic pain.  Menopause. This may include hot flashes.  Stroke.  Abuse of alcohol, tobacco, or illegal drugs.  Depression.  Caffeine.  Neurological disorders, such as Alzheimer disease.  An overactive thyroid (hyperthyroidism).  The cause of insomnia may not be known. What increases the risk? Risk factors for insomnia include:  Gender. Women are more commonly affected than men.  Age. Insomnia is more common as you get older.  Stress. This may involve your professional or personal life.  Income. Insomnia is more common in people with lower income.  Lack of exercise.  Irregular work schedule or night shifts.  Traveling between different time zones.  What are the signs or symptoms? If you have insomnia, trouble falling asleep or trouble staying asleep is the main symptom. This may lead to other symptoms, such as:  Feeling fatigued.  Feeling nervous about going to sleep.  Not feeling rested in the morning.  Having trouble concentrating.  Feeling irritable, anxious, or depressed.  How is this treated? Treatment for insomnia depends  on the cause. If your insomnia is caused by an underlying condition, treatment will focus on addressing the condition. Treatment may also include:  Medicines to help you sleep.  Counseling or therapy.  Lifestyle adjustments.  Follow these instructions at home:  Take medicines only as directed by your health care provider.  Keep regular sleeping and waking hours. Avoid naps.  Keep a sleep diary to help you and your health care provider figure out what could be causing your insomnia. Include: ? When you sleep. ? When you wake up during the night. ? How well you sleep. ? How rested you feel the next day. ? Any side effects of medicines you are taking. ? What you eat and drink.  Make your bedroom a comfortable place where it is easy to fall asleep: ? Put up shades or special blackout curtains to block light from outside. ? Use a white noise machine to block noise. ? Keep the temperature  cool.  Exercise regularly as directed by your health care provider. Avoid exercising right before bedtime.  Use relaxation techniques to manage stress. Ask your health care provider to suggest some techniques that may work well for you. These may include: ? Breathing exercises. ? Routines to release muscle tension. ? Visualizing peaceful scenes.  Cut back on alcohol, caffeinated beverages, and cigarettes, especially close to bedtime. These can disrupt your sleep.  Do not overeat or eat spicy foods right before bedtime. This can lead to digestive discomfort that can make it hard for you to sleep.  Limit screen use before bedtime. This includes: ? Watching TV. ? Using your smartphone, tablet, and computer.  Stick to a routine. This can help you fall asleep faster. Try to do a quiet activity, brush your teeth, and go to bed at the same time each night.  Get out of bed if you are still awake after 15 minutes of trying to sleep. Keep the lights down, but try reading or doing a quiet activity. When  you feel sleepy, go back to bed.  Make sure that you drive carefully. Avoid driving if you feel very sleepy.  Keep all follow-up appointments as directed by your health care provider. This is important. Contact a health care provider if:  You are tired throughout the day or have trouble in your daily routine due to sleepiness.  You continue to have sleep problems or your sleep problems get worse. Get help right away if:  You have serious thoughts about hurting yourself or someone else. This information is not intended to replace advice given to you by your health care provider. Make sure you discuss any questions you have with your health care provider. Document Released: 03/25/2000 Document Revised: 08/28/2015 Document Reviewed: 12/27/2013 Elsevier Interactive Patient Education  2018 [place] all medications as directed. Increase water intake, strive for at least 75 ounces/day.   Follow Heart Healthy diet Increase regular exercise.  Recommend at least 30 minutes daily, 5 days per week of walking, jogging, biking, swimming, YouTube/Pinterest workout videos. Atorvastatin refilled, please call pharmacy when you need additional refills. We will call you when lab results are available. Recommend consistent use of CPAP, follow-up with Sleep Medicine as directed. Recommend complete physical this fall. WELCOME TO THE PRACTICE!

## 2018-01-17 NOTE — Assessment & Plan Note (Signed)
>>  ASSESSMENT AND PLAN FOR LIPID DISORDER WRITTEN ON 01/17/2018 11:55 AM BY DANFORD, KATY D, NP  Lipids drawn today Currently on Atorvastatin 20mg  QD Continue to reduce saturated fat from diet

## 2018-01-17 NOTE — Assessment & Plan Note (Signed)
Stable on Paroxetine 30mg 

## 2018-01-17 NOTE — Assessment & Plan Note (Signed)
BP at goal 118/76, HR 61 Continue Lisinopril 5mg  QD

## 2018-01-17 NOTE — Assessment & Plan Note (Signed)
Continue all medications as directed. Increase water intake, strive for at least 75 ounces/day.   Follow Heart Healthy diet Increase regular exercise.  Recommend at least 30 minutes daily, 5 days per week of walking, jogging, biking, swimming, YouTube/Pinterest workout videos. Atorvastatin refilled, please call pharmacy when you need additional refills. We will call you when lab results are available. Recommend consistent use of CPAP, follow-up with Sleep Medicine as directed. Recommend complete physical this fall.

## 2018-01-18 LAB — COMPREHENSIVE METABOLIC PANEL
A/G RATIO: 1.8 (ref 1.2–2.2)
ALBUMIN: 4.4 g/dL (ref 3.5–5.5)
ALK PHOS: 47 IU/L (ref 39–117)
ALT: 35 IU/L (ref 0–44)
AST: 24 IU/L (ref 0–40)
BILIRUBIN TOTAL: 0.7 mg/dL (ref 0.0–1.2)
BUN / CREAT RATIO: 14 (ref 9–20)
BUN: 14 mg/dL (ref 6–24)
CHLORIDE: 102 mmol/L (ref 96–106)
CO2: 20 mmol/L (ref 20–29)
Calcium: 9.1 mg/dL (ref 8.7–10.2)
Creatinine, Ser: 0.99 mg/dL (ref 0.76–1.27)
GFR calc non Af Amer: 88 mL/min/{1.73_m2} (ref >59)
GFR, EST AFRICAN AMERICAN: 102 mL/min/{1.73_m2} (ref >59)
GLOBULIN, TOTAL: 2.4 g/dL (ref 1.5–4.5)
Glucose: 101 mg/dL — ABNORMAL HIGH (ref 65–99)
POTASSIUM: 4.5 mmol/L (ref 3.5–5.2)
SODIUM: 141 mmol/L (ref 134–144)
TOTAL PROTEIN: 6.8 g/dL (ref 6.0–8.5)

## 2018-01-18 LAB — CBC WITH DIFFERENTIAL/PLATELET
BASOS: 1 %
Basophils Absolute: 0 10*3/uL (ref 0.0–0.2)
EOS (ABSOLUTE): 0.2 10*3/uL (ref 0.0–0.4)
Eos: 3 %
Hematocrit: 48.2 % (ref 37.5–51.0)
Hemoglobin: 16.5 g/dL (ref 13.0–17.7)
IMMATURE GRANULOCYTES: 0 %
Immature Grans (Abs): 0 10*3/uL (ref 0.0–0.1)
Lymphocytes Absolute: 1.3 10*3/uL (ref 0.7–3.1)
Lymphs: 23 %
MCH: 29.6 pg (ref 26.6–33.0)
MCHC: 34.2 g/dL (ref 31.5–35.7)
MCV: 86 fL (ref 79–97)
MONOS ABS: 0.6 10*3/uL (ref 0.1–0.9)
Monocytes: 10 %
NEUTROS PCT: 63 %
Neutrophils Absolute: 3.7 10*3/uL (ref 1.4–7.0)
PLATELETS: 331 10*3/uL (ref 150–450)
RBC: 5.58 x10E6/uL (ref 4.14–5.80)
RDW: 12.9 % (ref 12.3–15.4)
WBC: 5.9 10*3/uL (ref 3.4–10.8)

## 2018-01-18 LAB — LIPID PANEL
CHOL/HDL RATIO: 4.5 ratio (ref 0.0–5.0)
Cholesterol, Total: 219 mg/dL — ABNORMAL HIGH (ref 100–199)
HDL: 49 mg/dL (ref >39)
LDL Calculated: 142 mg/dL — ABNORMAL HIGH (ref 0–99)
TRIGLYCERIDES: 140 mg/dL (ref 0–149)
VLDL CHOLESTEROL CAL: 28 mg/dL (ref 5–40)

## 2018-01-18 LAB — HEMOGLOBIN A1C
Est. average glucose Bld gHb Est-mCnc: 108 mg/dL
HEMOGLOBIN A1C: 5.4 % (ref 4.8–5.6)

## 2018-01-18 LAB — TSH: TSH: 0.785 u[IU]/mL (ref 0.450–4.500)

## 2018-02-14 DIAGNOSIS — G4733 Obstructive sleep apnea (adult) (pediatric): Secondary | ICD-10-CM | POA: Diagnosis not present

## 2018-03-12 NOTE — Progress Notes (Signed)
Subjective:    Patient ID: Paul Carson, male    DOB: 1965/11/23, 52 y.o.   MRN: [id number]  HPI: 01/17/18 OV:  Paul Carson is here to establish as a new pt. He is pleasant 52 year old male. PMH: HTN, HLD, OCD, Insomnia, and newly dx'd OSA- he is inconsistent with CPAP use He reports medication compliance, denies SE He reports >15 lbs wt loss over the last year r/t reduction in Pepsi and fast food intake. 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 [employer] Her report chronic bil shoulder pain, however last week developed increase in R shoulder pain r/t repetitive use- extending arm to place mail, then closes boxes R shoulder pain is constant, described as sharp ache and rated 5/10 He has taken one dose of Ibuprofen He has never seen Orthopedic Specialist or PT for shoulder pain  Healthcare Maintenance: Colonoscopy-last 04/21/17, re[eat in 3 years Immunizations-UTD LDCT-N/A AAA-N/A   Patient Care Team    Relationship Specialty Notifications Start End  [name], NP PCP - General Family Medicine  01/16/18     Patient Active Problem List   Diagnosis Date Noted  . Hyperlipidemia 03/14/2018  . Acute pain of right shoulder 03/14/2018  . Healthcare maintenance 01/17/2018  . Insomnia 01/17/2018  . OSA (obstructive sleep apnea) 06/06/2017  . Adenomatous colon polyp 04/29/2017  . Obsessive-compulsive disorder 01/07/2017  . Gout 03/30/2012  . HTN (hypertension) 03/27/2012  . Lipid disorder 03/27/2012     Past Medical History:  Diagnosis Date  . Allergy    seasonal  . Anxiety   . Heart murmur   .  Hyperlipidemia   . Hypertension   . OCD (obsessive compulsive disorder)   . OSA (obstructive sleep apnea) 06/06/2017     Past Surgical History:  Procedure Laterality Date  . carpal tunel  2012   left wrist   . CARPAL TUNNEL RELEASE  09/2010   left wrist  . VASECTOMY       Family History  Problem Relation Age of Onset  . Hypertension Father   . Healthy Mother   . Healthy Brother   . Healthy Daughter   . Healthy Son   . Colon cancer Neg Hx   . Colon polyps Neg Hx   . Rectal cancer Neg Hx   . Stomach cancer Neg Hx      Social History   Substance and Sexual Activity  Drug Use No     Social History   Substance and Sexual Activity  Alcohol Use No  . Alcohol/week: 0.0 standard drinks     Social History   Tobacco Use  Smoking Status Former Smoker  . Packs/day: 0.50  . Years: 10.00  . Pack years: 5.00  . Types: Cigarettes  . Last attempt to quit: 06/29/1998  . Years since quitting: 19.7  Smokeless Tobacco Never Used     Outpatient Encounter Medications as of 03/14/2018  Medication Sig  . atorvastatin (LIPITOR) 20 MG tablet Take 1 tablet (20 mg total) by mouth daily.  Marland Kitchen lisinopril (PRINIVIL,ZESTRIL) 5 MG tablet TAKE 1 TABLET BY MOUTH EVERY DAY  . Melatonin 5  MG TABS Take by mouth at bedtime as needed.  Marland Kitchen PARoxetine (PAXIL) 30 MG tablet TAKE 1 TABLET BY MOUTH EVERY DAY  . [DISCONTINUED] 0.9 %  sodium chloride infusion    No facility-administered encounter medications on file as of 03/14/2018.     Allergies: Patient has no known allergies.  Body mass index is 28.36 kg/m.  Blood pressure 131/82, pulse (!) 58, temperature 98.5 F (36.9 C), temperature source Oral, height 5\' 6"  (1.676 m), weight 175 lb 11.2 oz (79.7 kg), SpO2 98 %.  Review of Systems  Constitutional: Positive for fatigue. Negative for activity change, appetite change, chills, diaphoresis, fever and unexpected weight change.  HENT: Negative for congestion.   Eyes: Negative for visual  disturbance.  Respiratory: Negative for cough, chest tightness, shortness of breath, wheezing and stridor.   Cardiovascular: Negative for chest pain, palpitations and leg swelling.  Gastrointestinal: Negative for abdominal distention, abdominal pain, blood in stool, constipation, diarrhea, nausea and vomiting.  Endocrine: Negative for cold intolerance, heat intolerance, polydipsia, polyphagia and polyuria.  Genitourinary: Negative for difficulty urinating and flank pain.  Musculoskeletal: Negative for arthralgias, back pain, gait problem, joint swelling, myalgias, neck pain and neck stiffness.  Skin: Negative for pallor, rash and wound.  Neurological: Negative for dizziness and headaches.  Hematological: Does not bruise/bleed easily.  Psychiatric/Behavioral: Positive for sleep disturbance. Negative for behavioral problems, confusion, decreased concentration, dysphoric mood, hallucinations, self-injury and suicidal ideas. The patient is not nervous/anxious and is not hyperactive.        Objective:   Physical Exam  Constitutional: He is oriented to person, place, and time. He appears well-developed and well-nourished. No distress.  HENT:  Head: Normocephalic and atraumatic.  Right Ear: External ear normal. Tympanic membrane is not erythematous and not bulging. No decreased hearing is noted.  Left Ear: External ear normal. Tympanic membrane is not erythematous and not bulging. No decreased hearing is noted.  Nose: Nose normal. No mucosal edema or rhinorrhea. Right sinus exhibits no maxillary sinus tenderness and no frontal sinus tenderness. Left sinus exhibits no maxillary sinus tenderness and no frontal sinus tenderness.  Mouth/Throat: Uvula is midline, oropharynx is clear and moist and mucous membranes are normal. Tonsils are 0 on the right. Tonsils are 0 on the left.  Eyes: Conjunctivae are normal.  Neck: Normal range of motion. Neck supple.  Cardiovascular: Normal rate, regular rhythm,  normal heart sounds and intact distal pulses.  No murmur heard. Pulmonary/Chest: Effort normal and breath sounds normal. No stridor. No respiratory distress. He has no wheezes. He has no rales. He exhibits no tenderness.  Abdominal: Soft. Bowel sounds are normal. He exhibits no distension and no mass. There is no tenderness. There is no rebound and no guarding. No hernia.  Genitourinary:  Genitourinary Comments: Declined DRE/genital examination   Musculoskeletal: Normal range of motion. He exhibits tenderness.       Right shoulder: He exhibits tenderness. He exhibits normal range of motion and no swelling.       Left shoulder: Normal.  Lymphadenopathy:    He has no cervical adenopathy.  Neurological: He is alert and oriented to person, place, and time.  Skin: Skin is warm and dry. Capillary refill takes less than 2 seconds. No rash noted. He is not diaphoretic. No erythema. No pallor.  Psychiatric: He has a normal mood and affect. His behavior is normal. Judgment and thought content normal.  Nursing note and vitals reviewed.     Assessment & Plan:   1. Need for  influenza vaccination   2. Need for Tdap vaccination   3. Healthcare maintenance   4. Hyperlipidemia, unspecified hyperlipidemia type   5. Acute pain of right shoulder     Healthcare maintenance Reduce sugary drinks by at least 50%- you can do it! Increase water intake- strive for at least 2 x 16 oz bottles/day. Continue to remain active and follow a heart healthy diet. Recommend OTC Tumeric and OTC Glucosamine for osteoarthritis and joint health. If your right shoulder pain does not improve in 4 weeks, call clinic and we will refer you to physical therapy. Follow-up in 6 months.  Hyperlipidemia The 10-year ASCVD risk score [name], et al., 2013) is: 5.4%   Values used to calculate the score:     Age: 31 years     Sex: Male     Is Non-Hispanic African American: No     Diabetic: No     Tobacco smoker: No      Systolic Blood Pressure: 201 mmHg     Is BP treated: Yes     HDL Cholesterol: 49 mg/dL     Total Cholesterol: 219 mg/dL  [id number] Currently on Atorvastatin 20mg  QD  Acute pain of right shoulder Recommend OTC Tumeric and OTC Glucosamine  If pain still worsening >4 weeks, call and will refer to PT    FOLLOW-UP:  Return in about 6 months (around 09/13/2018) for Regular Follow Up, HTN, Hypercholestermia.

## 2018-03-14 ENCOUNTER — Encounter: Payer: Self-pay | Admitting: Adult Health

## 2018-03-14 ENCOUNTER — Ambulatory Visit (INDEPENDENT_AMBULATORY_CARE_PROVIDER_SITE_OTHER): Payer: Federal, State, Local not specified - PPO | Admitting: Adult Health

## 2018-03-14 VITALS — BP 131/82 | HR 58 | Temp 98.5°F | Ht 66.0 in | Wt 175.7 lb

## 2018-03-14 DIAGNOSIS — Z23 Encounter for immunization: Secondary | ICD-10-CM

## 2018-03-14 DIAGNOSIS — E785 Hyperlipidemia, unspecified: Secondary | ICD-10-CM | POA: Diagnosis not present

## 2018-03-14 DIAGNOSIS — Z0000 Encounter for general adult medical examination without abnormal findings: Secondary | ICD-10-CM

## 2018-03-14 DIAGNOSIS — M25511 Pain in right shoulder: Secondary | ICD-10-CM | POA: Insufficient documentation

## 2018-03-14 NOTE — Assessment & Plan Note (Signed)
Recommend OTC Tumeric and OTC Glucosamine  If pain still worsening >4 weeks, call and will refer to PT

## 2018-03-14 NOTE — Assessment & Plan Note (Signed)
The 10-year ASCVD risk score Mikey Bussing DC Brooke Bonito., et al., 2013) is: 5.4%   Values used to calculate the score:     Age: 52 years     Sex: Male     Is Non-Hispanic African American: No     Diabetic: No     Tobacco smoker: No     Systolic Blood Pressure: 209 mmHg     Is BP treated: Yes     HDL Cholesterol: 49 mg/dL     Total Cholesterol: 219 mg/dL  [id number] Currently on Atorvastatin 20mg  QD

## 2018-03-14 NOTE — Assessment & Plan Note (Signed)
Reduce sugary drinks by at least 50%- you can do it! Increase water intake- strive for at least 2 x 16 oz bottles/day. Continue to remain active and follow a heart healthy diet. Recommend OTC Tumeric and OTC Glucosamine for osteoarthritis and joint health. If your right shoulder pain does not improve in 4 weeks, call clinic and we will refer you to physical therapy. Follow-up in 6 months.

## 2018-03-14 NOTE — Patient Instructions (Addendum)

## 2018-05-16 DIAGNOSIS — G4733 Obstructive sleep apnea (adult) (pediatric): Secondary | ICD-10-CM | POA: Diagnosis not present

## 2018-09-11 NOTE — Progress Notes (Signed)
Subjective:    Patient ID: Paul Carson, male    DOB: [birth year], 53 y.o.   MRN: [id number]  HPI: 01/17/18 OV:  Paul Carson is here to establish as a new pt. He is pleasant 53 year old male. PMH: HTN, HLD, OCD, Insomnia, and newly dx'd OSA- he is inconsistent with CPAP use He reports medication compliance, denies SE He reports >15 lbs wt loss over the last year r/t reduction in Pepsi and fast food intake. 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 He denies CP/dyspnea/dizziness/HA/palpitations. He continues to consume a diet high in sodium and saturated fat. He reports only "sips of water a day", he prefers to hydrate with Pepsi and Sweat Tea. He continues to abstain from tobacco/vape/ETOG His only physical activity is working in [employer] He has one acute complaint today- "A bulge" at bass of R anterior neck, that will develop and recede depending on level of use of his RUE. He is R hand dominant and will perform frequent extension, lifting, rotation  during his shift at [employer]. He reports that the bulge has been noticeable at times >3 years. He denies acute injury/trauma to the area He reports new sx of pain started 2 weeks ago. Pain with palpation and with certain movements of RUE. Pain described as "dull ache", rated 3/10. He has hx of bil carpal tunnel, surgical intervention on LUE only He has never had imaging of area of concern He reports stable mood, denies SI/HI He has not used CPAP >6 months, due to claustrophobia and "noise of the machine" Discussed the negative impacts of untreated OSA on his cardiovascular system Strongly encouraged to resume nightly CPAP  Advised him to contact the company that manages his CPAP machine to discuss equipment options to help him better tolerate the machine at night. Patient Care Team    Relationship Specialty Notifications Start End  [name], NP PCP - General Family Medicine  01/16/18     Patient Active Problem List   Diagnosis Date Noted  . Neck mass 09/13/2018  . Hyperlipidemia 03/14/2018  . Acute pain of right shoulder 03/14/2018  . Healthcare maintenance 01/17/2018  . Insomnia 01/17/2018  . OSA (obstructive sleep apnea) 06/06/2017  . Adenomatous colon polyp 04/29/2017  . Obsessive-compulsive disorder 01/07/2017  . Gout 03/30/2012  . HTN (hypertension) 03/27/2012  . Lipid disorder 03/27/2012     Past Medical History:  Diagnosis Date  . Allergy    seasonal  . Anxiety   . Heart murmur   . Hyperlipidemia   .  Hypertension   . OCD (obsessive compulsive disorder)   . OSA (obstructive sleep apnea) 06/06/2017     Past Surgical History:  Procedure Laterality Date  . carpal tunel  2012   left wrist   . CARPAL TUNNEL RELEASE  09/2010   left wrist  . VASECTOMY       Family History  Problem Relation Age of Onset  . Hypertension Father   . Healthy Mother   . Healthy Brother   . Healthy Daughter   . Healthy Son   . Colon cancer Neg Hx   . Colon polyps Neg Hx   .  Rectal cancer Neg Hx   . Stomach cancer Neg Hx      Social History   Substance and Sexual Activity  Drug Use No     Social History   Substance and Sexual Activity  Alcohol Use No  . Alcohol/week: 0.0 standard drinks     Social History   Tobacco Use  Smoking Status Former Smoker  . Packs/day: 0.50  . Years: 10.00  . Pack years: 5.00  . Types: Cigarettes  . Last attempt to quit: 06/29/1998  . Years since quitting: 20.2  Smokeless Tobacco Never Used     Outpatient Encounter Medications as of 09/13/2018  Medication Sig  . atorvastatin (LIPITOR) 20 MG tablet Take 1 tablet (20 mg total) by mouth daily.  Marland Kitchen lisinopril (ZESTRIL) 10 MG tablet Take 1 tablet (10 mg total) by mouth daily.  . Melatonin 5 MG TABS Take by mouth at bedtime as needed.  Marland Kitchen PARoxetine (PAXIL) 30 MG tablet TAKE 1 TABLET BY MOUTH EVERY DAY  . [DISCONTINUED] lisinopril (PRINIVIL,ZESTRIL) 5 MG tablet TAKE 1 TABLET BY MOUTH EVERY DAY   No facility-administered encounter medications on file as of 09/13/2018.     Allergies: Patient has no known allergies.  Body mass index is 29.29 kg/m.  Blood pressure (!) 147/95, pulse (!) 53, temperature 98.2 F (36.8 C), height 5\' 6"  (1.676 m), weight 181 lb 8 oz (82.3 kg), SpO2 96 %.  Review of Systems  Constitutional: Positive for fatigue. Negative for activity change, appetite change, chills, diaphoresis, fever and unexpected weight change.  HENT: Negative for congestion.   Eyes: Negative for visual disturbance.  Respiratory: Negative for cough, chest tightness, shortness of breath, wheezing and stridor.   Cardiovascular: Negative for chest pain, palpitations and leg swelling.  Gastrointestinal: Negative for abdominal distention, anal bleeding, blood in stool, constipation, diarrhea, nausea and vomiting.  Endocrine: Negative for cold intolerance, heat intolerance, polydipsia, polyphagia and polyuria.  Genitourinary: Negative for difficulty urinating and flank pain.   Musculoskeletal: Negative for arthralgias, back pain, gait problem, joint swelling, myalgias, neck pain and neck stiffness.  Skin:       Mass at base of R side of neck   Neurological: Negative for dizziness and headaches.  Hematological: Negative for adenopathy. Does not bruise/bleed easily.  Psychiatric/Behavioral: Negative for agitation, behavioral problems, confusion, decreased concentration, dysphoric mood, hallucinations, self-injury, sleep disturbance and suicidal ideas. The patient is not nervous/anxious and is not hyperactive.        Objective:   Physical Exam Vitals signs and nursing note reviewed.  Constitutional:      General: He is not in acute distress.    Appearance: Normal appearance. He is not ill-appearing, toxic-appearing or diaphoretic.  HENT:     Head: Normocephalic and atraumatic.  Eyes:     Extraocular Movements: Extraocular movements intact.     Conjunctiva/sclera: Conjunctivae normal.  Pupils: Pupils are equal, round, and reactive to light.  Neck:     Musculoskeletal: Normal range of motion. Normal range of motion. Muscular tenderness present. No erythema or injury.      Comments: R anterior neck-one palpable, mobile, soft mass noted over R scalene muscle. Mass measures approx 5.5cm in diameter  Cardiovascular:     Rate and Rhythm: Normal rate.     Pulses: Normal pulses.     Heart sounds: Normal heart sounds. No murmur. No friction rub. No gallop.   Pulmonary:     Effort: Pulmonary effort is normal. No respiratory distress.     Breath sounds: Normal breath sounds. No stridor. No wheezing, rhonchi or rales.  Chest:     Chest wall: No tenderness.  Skin:    General: Skin is warm and dry.     Capillary Refill: Capillary refill takes less than 2 seconds.  Neurological:     Mental Status: He is alert and oriented to person, place, and time.  Psychiatric:        Mood and Affect: Mood normal.        Behavior: Behavior normal.        Thought Content:  Thought content normal.        Judgment: Judgment normal.       Assessment & Plan:   1. Essential hypertension   2. Neck mass   3. Healthcare maintenance   4. OSA (obstructive sleep apnea)     HTN (hypertension) Both of your blood pressure readings were above goal. Your Lisinopril was increased from 5mg  to 10mg  once daily. Please check your blood pressure and heart rate daily. Please schedule a TeleMedicine visit in 2 weeks and provide these readings. Follow DASH diet, increase plain water intake. Increase regular exercise.  Recommend at least 30 minutes daily, 5 days per week of walking, jogging, biking, swimming, YouTube/Pinterest workout videos.   Healthcare maintenance 2 week f/u TeleMedicine Pleas continue to social distance and wear a mask when in public.  Neck mass Ultrasound has been ordered for the mass at the bass of the right side of your neck.  Someone from the imaging center will call you to schedule your appt.   OSA (obstructive sleep apnea) Strongly encouraged to resume nightly CPAP  Advised him to contact the company that manages his CPAP machine to discuss equipment options to help him better tolerate the machine at night.     FOLLOW-UP:  Return in about 2 weeks (around 09/27/2018) for Regular Follow Up, HTN, TeleMedicine Visit.

## 2018-09-13 ENCOUNTER — Other Ambulatory Visit: Payer: Self-pay

## 2018-09-13 ENCOUNTER — Ambulatory Visit: Payer: Federal, State, Local not specified - PPO | Admitting: Adult Health

## 2018-09-13 ENCOUNTER — Encounter: Payer: Self-pay | Admitting: Adult Health

## 2018-09-13 VITALS — BP 147/95 | HR 53 | Temp 98.2°F | Ht 66.0 in | Wt 181.5 lb

## 2018-09-13 DIAGNOSIS — I10 Essential (primary) hypertension: Secondary | ICD-10-CM

## 2018-09-13 DIAGNOSIS — Z0000 Encounter for general adult medical examination without abnormal findings: Secondary | ICD-10-CM | POA: Diagnosis not present

## 2018-09-13 DIAGNOSIS — G4733 Obstructive sleep apnea (adult) (pediatric): Secondary | ICD-10-CM

## 2018-09-13 DIAGNOSIS — R221 Localized swelling, mass and lump, neck: Secondary | ICD-10-CM | POA: Diagnosis not present

## 2018-09-13 MED ORDER — LISINOPRIL 10 MG PO TABS: 10.0000 mg | ORAL_TABLET | Freq: Every day | ORAL | 0 refills | Status: DC

## 2018-09-13 NOTE — Assessment & Plan Note (Signed)
Strongly encouraged to resume nightly CPAP  Advised him to contact the company that manages his CPAP machine to discuss equipment options to help him better tolerate the machine at night.

## 2018-09-13 NOTE — Patient Instructions (Addendum)
Managing Your Hypertension Hypertension is commonly called high blood pressure. This is when the force of your blood pressing against the walls of your arteries is too strong. Arteries are blood vessels that carry blood from your heart throughout your body. Hypertension forces the heart to work harder to pump blood, and may cause the arteries to become narrow or stiff. Having untreated or uncontrolled hypertension can cause heart attack, stroke, kidney disease, and other problems. What are blood pressure readings? A blood pressure reading consists of a higher number over a lower number. Ideally, your blood pressure should be below 120/80. The first ("top") number is called the systolic pressure. It is a measure of the pressure in your arteries as your heart beats. The second ("bottom") number is called the diastolic pressure. It is a measure of the pressure in your arteries as the heart relaxes. What does my blood pressure reading mean? Blood pressure is classified into four stages. Based on your blood pressure reading, your health care provider may use the following stages to determine what type of treatment you need, if any. Systolic pressure and diastolic pressure are measured in a unit called mm Hg. Normal  Systolic pressure: below 784.  Diastolic pressure: below 80. Elevated  Systolic pressure: 696-295.  Diastolic pressure: below 80. Hypertension stage 1  Systolic pressure: 284-132.  Diastolic pressure: 44-01. Hypertension stage 2  Systolic pressure: 027 or above.  Diastolic pressure: 90 or above. What health risks are associated with hypertension? Managing your hypertension is an important responsibility. Uncontrolled hypertension can lead to:  A heart attack.  A stroke.  A weakened blood vessel (aneurysm).  Heart failure.  Kidney damage.  Eye damage.  Metabolic syndrome.  Memory and concentration problems. What changes can I make to manage my hypertension?  Hypertension can be managed by making lifestyle changes and possibly by taking medicines. Your health care provider will help you make a plan to bring your blood pressure within a normal range. Eating and drinking   Eat a diet that is high in fiber and potassium, and low in salt (sodium), added sugar, and fat. An example eating plan is called the DASH (Dietary Approaches to Stop Hypertension) diet. To eat this way: ? Eat plenty of fresh fruits and vegetables. Try to fill half of your plate at each meal with fruits and vegetables. ? Eat whole grains, such as whole wheat pasta, brown rice, or whole grain bread. Fill about one quarter of your plate with whole grains. ? Eat low-fat diary products. ? Avoid fatty cuts of meat, processed or cured meats, and poultry with skin. Fill about one quarter of your plate with lean proteins such as fish, chicken without skin, beans, eggs, and tofu. ? Avoid premade and processed foods. These tend to be higher in sodium, added sugar, and fat.  Reduce your daily sodium intake. Most people with hypertension should eat less than 1,500 mg of sodium a day.  Limit alcohol intake to no more than 1 drink a day for nonpregnant women and 2 drinks a day for men. One drink equals 12 oz of beer, 5 oz of wine, or 1 oz of hard liquor. Lifestyle  Work with your health care provider to maintain a healthy body weight, or to lose weight. Ask what an ideal weight is for you.  Get at least 30 minutes of exercise that causes your heart to beat faster (aerobic exercise) most days of the week. Activities may include walking, swimming, or biking.  Include exercise  to strengthen your muscles (resistance exercise), such as weight lifting, as part of your weekly exercise routine. Try to do these types of exercises for 30 minutes at least 3 days a week.  Do not use any products that contain nicotine or tobacco, such as cigarettes and e-cigarettes. If you need help quitting, ask your health  care provider.  Control any long-term (chronic) conditions you have, such as high cholesterol or diabetes. Monitoring  Monitor your blood pressure at home as told by your health care provider. Your personal target blood pressure may vary depending on your medical conditions, your age, and other factors.  Have your blood pressure checked regularly, as often as told by your health care provider. Working with your health care provider  Review all the medicines you take with your health care provider because there may be side effects or interactions.  Talk with your health care provider about your diet, exercise habits, and other lifestyle factors that may be contributing to hypertension.  Visit your health care provider regularly. Your health care provider can help you create and adjust your plan for managing hypertension. Will I need medicine to control my blood pressure? Your health care provider may prescribe medicine if lifestyle changes are not enough to get your blood pressure under control, and if:  Your systolic blood pressure is 130 or higher.  Your diastolic blood pressure is 80 or higher. Take medicines only as told by your health care provider. Follow the directions carefully. Blood pressure medicines must be taken as prescribed. The medicine does not work as well when you skip doses. Skipping doses also puts you at risk for problems. Contact a health care provider if:  You think you are having a reaction to medicines you have taken.  You have repeated (recurrent) headaches.  You feel dizzy.  You have swelling in your ankles.  You have trouble with your vision. Get help right away if:  You develop a severe headache or confusion.  You have unusual weakness or numbness, or you feel faint.  You have severe pain in your chest or abdomen.  You vomit repeatedly.  You have trouble breathing. Summary  Hypertension is when the force of blood pumping through your arteries  is too strong. If this condition is not controlled, it may put you at risk for serious complications.  Your personal target blood pressure may vary depending on your medical conditions, your age, and other factors. For most people, a normal blood pressure is less than 120/80.  Hypertension is managed by lifestyle changes, medicines, or both. Lifestyle changes include weight loss, eating a healthy, low-sodium diet, exercising more, and limiting alcohol. This information is not intended to replace advice given to you by your health care provider. Make sure you discuss any questions you have with your health care provider. Document Released: 12/21/2011 Document Revised: 02/24/2016 Document Reviewed: 02/24/2016 Elsevier Interactive Patient Education  2019 [employer].  Both of your blood pressure readings were above goal. Your Lisinopril was increased from 5mg  to 10mg  once daily. Please check your blood pressure and heart rate daily. Please schedule a TeleMedicine visit in 2 weeks and provide these readings. Follow DASH diet, increase plain water intake. Increase regular exercise.  Recommend at least 30 minutes daily, 5 days per week of walking, jogging, biking, swimming, YouTube/Pinterest workout videos. Ultrasound has been ordered for the mass at the bass of the right side of your neck.  Someone from the imaging center will call you to schedule your  appt. Pleas continue to social distance and wear a mask when in public. NICE TO SEE YOU!

## 2018-09-13 NOTE — Assessment & Plan Note (Signed)
Both of your blood pressure readings were above goal. Your Lisinopril was increased from 5mg  to 10mg  once daily. Please check your blood pressure and heart rate daily. Please schedule a TeleMedicine visit in 2 weeks and provide these readings. Follow DASH diet, increase plain water intake. Increase regular exercise.  Recommend at least 30 minutes daily, 5 days per week of walking, jogging, biking, swimming, YouTube/Pinterest workout videos.

## 2018-09-13 NOTE — Assessment & Plan Note (Signed)
2 week f/u TeleMedicine Pleas continue to social distance and wear a mask when in public.

## 2018-09-13 NOTE — Assessment & Plan Note (Signed)
Ultrasound has been ordered for the mass at the bass of the right side of your neck.  Someone from the imaging center will call you to schedule your appt.

## 2018-09-17 ENCOUNTER — Ambulatory Visit
Admission: RE | Admit: 2018-09-17 | Discharge: 2018-09-17 | Disposition: A | Discharge status: Home / Self Care | Payer: Federal, State, Local not specified - PPO | Source: Ambulatory Visit | Attending: Adult Health | Admitting: Adult Health

## 2018-09-17 DIAGNOSIS — R221 Localized swelling, mass and lump, neck: Secondary | ICD-10-CM

## 2018-09-25 NOTE — Progress Notes (Signed)
Virtual Visit via Video Note  I connected with Paul Carson on 06/17/2020at  9:45 AM EDT by a video enabled telemedicine application and verified that I am speaking with the correct person using two identifiers.  Location: Patient: Home Provider: In Clinic   I discussed the limitations of evaluation and management by telemedicine and the availability of in person appointments. The patient expressed understanding and agreed to proceed.  History of Present Illness: 09/13/2018 OV: Paul Carson is here for 6 month f/u: HTN, HLD, OCD, Insomnia He reports medication compliance, denies SE He infrequently checks his BP at home, he reports checking it last week and states "I think it was 130 over 70 something". He denies CP/dyspnea/dizziness/HA/palpitations. He continues to consume a diet high in sodium and saturated fat. He reports only "sips of water a day", he prefers to hydrate with Pepsi and Sweat Tea. He continues to abstain from tobacco/vape/ETOG His only physical activity is working in [employer] He has one acute complaint today- "A bulge" at bass of R anterior neck, that will develop and recede depending on level of use of his RUE. He is R hand dominant and will perform frequent extension, lifting, rotation during his shift at [employer]. He reports that the bulge has been noticeable at times >3 years. He denies acute injury/trauma to the area He reports new sx of pain started 2 weeks ago. Pain with palpation and with certain movements of RUE. Pain described as "dull ache", rated 3/10. He has hx of bil carpal tunnel, surgical intervention on LUE only He has never had imaging of area of concern He reports stable mood, denies SI/HI He has not used CPAP >6 months, due to claustrophobia and "noise of the machine" Discussed the negative impacts of untreated OSA on his cardiovascular system Strongly encouraged to resume nightly CPAP  Advised him to contact the company that manages his CPAP machine  to discuss equipment options to help him better tolerate the machine at night.  09/26/2018 OV: Paul Carson calls in today for f/u: HTN and to discuss findings of recent imaging Your Lisinopril was increased from 5mg  to 10mg  once daily. Please check your blood pressure and heart rate daily. Please schedule a TeleMedicine visit in 2 weeks and provide these readings. Follow DASH diet, increase plain water intake. He reports home BP readings: SBP 110-140, has been in 110s consistently the last 4 days DBP 60-80 HR 50-70s He denies CP/dyspnea/HA/dizziness/palpitations He denies increase in fatigue He denies known first degree family hx of heart disease, heart block that required PPM, MI  09/17/2018 Neck US- FINDINGS: Sonographic evaluation of the right supraclavicular fossa was performed with additional evaluation of the left supraclavicular fossa for comparison.  No evidence of solid or cystic mass lesion. No circumscribed fatty lesion to suggest the presence of lipoma.  IMPRESSION: Negative sonographic evaluation of the right supraclavicular fossa. No mass or lesion identified.   Patient Care Team    Relationship Specialty Notifications Start End  [name], NP PCP - General Family Medicine  01/16/18     Patient Active Problem List   Diagnosis Date Noted  . Neck mass 09/13/2018  . Hyperlipidemia 03/14/2018  . Acute pain of right shoulder 03/14/2018  . Healthcare maintenance 01/17/2018  . Insomnia 01/17/2018  . OSA (obstructive sleep apnea) 06/06/2017  . Adenomatous colon polyp 04/29/2017  . Obsessive-compulsive disorder 01/07/2017  . Gout 03/30/2012  . HTN (hypertension) 03/27/2012  . Lipid disorder 03/27/2012     Past Medical History:  Diagnosis Date  . Allergy    seasonal  . Anxiety   . Heart murmur   . Hyperlipidemia   . Hypertension   . OCD (obsessive compulsive disorder)   . OSA (obstructive sleep apnea) 06/06/2017     Past Surgical History:   Procedure Laterality Date  . carpal tunel  2012   left wrist   . CARPAL TUNNEL RELEASE  09/2010   left wrist  . VASECTOMY       Family History  Problem Relation Age of Onset  . Hypertension Father   . Healthy Mother   . Healthy Brother   . Healthy Daughter   . Healthy Son   . Colon cancer Neg Hx   . Colon polyps Neg Hx   . Rectal cancer Neg Hx   . Stomach cancer Neg Hx      Social History   Substance and Sexual Activity  Drug Use No     Social History   Substance and Sexual Activity  Alcohol Use No  . Alcohol/week: 0.0 standard drinks     Social History   Tobacco Use  Smoking Status Former Smoker  . Packs/day: 0.50  . Years: 10.00  . Pack years: 5.00  . Types: Cigarettes  . Quit date: 06/29/1998  . Years since quitting: 20.2  Smokeless Tobacco Never Used     Outpatient Encounter Medications as of 09/26/2018  Medication Sig  . atorvastatin (LIPITOR) 20 MG tablet Take 1 tablet (20 mg total) by mouth daily.  Marland Kitchen lisinopril (ZESTRIL) 10 MG tablet Take 1 tablet (10 mg total) by mouth daily.  . Melatonin 5 MG TABS Take by mouth at bedtime as needed.  Marland Kitchen PARoxetine (PAXIL) 30 MG tablet TAKE 1 TABLET BY MOUTH EVERY DAY   No facility-administered encounter medications on file as of 09/26/2018.     Allergies: Patient has no known allergies.  Body mass index is 26.63 kg/m.  Blood pressure 118/68, height 5\' 7"  (1.702 m), weight 170 lb (77.1 kg).   Review of Systems: General:   Denies fever, chills, unexplained weight loss.  Optho/Auditory:   Denies visual changes, blurred vision/LOV Respiratory:   Denies SOB, DOE more than baseline levels.  Cardiovascular:   Denies chest pain, palpitations, new onset peripheral edema  Gastrointestinal:   Denies nausea, vomiting, diarrhea.  Genitourinary: Denies dysuria, freq/ urgency, flank pain or discharge from genitals.  Endocrine:     Denies hot or cold intolerance, polyuria, polydipsia. Musculoskeletal:   Denies  unexplained myalgias, joint swelling, unexplained arthralgias, gait problems.  Skin:  Denies rash, suspicious lesions Neurological:     Denies dizziness, unexplained weakness, numbness  Psychiatric/Behavioral:   Denies mood changes, suicidal or homicidal ideations, hallucinations Observations/Objective: No acute distress noted during the telephone conversation.  Assessment and Plan: Continue Lisinopril 10mg  once daily. When you need refill on medications, please request from pharmacy. Continue to check blood pressure and heart rate several times per week and call clinic if: Consistently blood pressure <100/60 or >140/90 Or Heart rate <55 or increase in fatigue or you develop dizziness. Remain well hydrated and follow heart healthy diet. Continue to social distance and wear a mask when in public.  Follow Up Instructions: 6 month OV   I discussed the assessment and treatment plan with the patient. The patient was provided an opportunity to ask questions and all were answered. The patient agreed with the plan and demonstrated an understanding of the instructions.   The patient was advised to call back or  seek an in-person evaluation if the symptoms worsen or if the condition fails to improve as anticipated.  I provided 15 minutes of non-face-to-face time during this encounter.   [name], NP

## 2018-09-26 ENCOUNTER — Other Ambulatory Visit: Payer: Self-pay

## 2018-09-26 ENCOUNTER — Encounter: Payer: Self-pay | Admitting: Adult Health

## 2018-09-26 ENCOUNTER — Ambulatory Visit (INDEPENDENT_AMBULATORY_CARE_PROVIDER_SITE_OTHER): Payer: Federal, State, Local not specified - PPO | Admitting: Adult Health

## 2018-09-26 DIAGNOSIS — I10 Essential (primary) hypertension: Secondary | ICD-10-CM | POA: Diagnosis not present

## 2018-09-26 NOTE — Assessment & Plan Note (Addendum)
BP 118/68- ambulatory readings have been at goal the last 4 days Assessment and Plan: Continue Lisinopril 10mg  once daily. When you need refill on medications, please request from pharmacy. Continue to check blood pressure and heart rate several times per week and call clinic if: Consistently blood pressure <100/60 or >140/90 Or Heart rate <55 or increase in fatigue or you develop dizziness. Remain well hydrated and follow heart healthy diet. Continue to social distance and wear a mask when in public.  Follow Up Instructions: 6 month OV   I discussed the assessment and treatment plan with the patient. The patient was provided an opportunity to ask questions and all were answered. The patient agreed with the plan and demonstrated an understanding of the instructions.   The patient was advised to call back or seek an in-person evaluation if the symptoms worsen or if the condition fails to improve as anticipated.

## 2018-12-05 ENCOUNTER — Other Ambulatory Visit: Payer: Self-pay | Admitting: Adult Health

## 2018-12-05 DIAGNOSIS — I10 Essential (primary) hypertension: Secondary | ICD-10-CM

## 2018-12-27 ENCOUNTER — Other Ambulatory Visit: Payer: Self-pay | Admitting: Emergency Medicine

## 2018-12-27 DIAGNOSIS — F429 Obsessive-compulsive disorder, unspecified: Secondary | ICD-10-CM

## 2018-12-27 NOTE — Telephone Encounter (Signed)
Forwarding medication refill request to the clinical pool for review. 

## 2018-12-30 ENCOUNTER — Other Ambulatory Visit: Payer: Self-pay | Admitting: Adult Health

## 2018-12-30 DIAGNOSIS — E789 Disorder of lipoprotein metabolism, unspecified: Secondary | ICD-10-CM

## 2019-01-21 ENCOUNTER — Other Ambulatory Visit: Payer: Self-pay | Admitting: Emergency Medicine

## 2019-01-21 DIAGNOSIS — F429 Obsessive-compulsive disorder, unspecified: Secondary | ICD-10-CM

## 2019-01-22 NOTE — Telephone Encounter (Signed)
Requested medication (s) are due for refill today: yes  Requested medication (s) are on the active medication list: yes  Last refill:  01/03/2018  Future visit scheduled: yes  Notes to clinic:  Review for refill   Requested Prescriptions  Pending Prescriptions Disp Refills   PARoxetine (PAXIL) 30 MG tablet [Pharmacy Med Name: PAROXETINE HCL 30 MG TABLET] 90 tablet 3    Sig: TAKE 1 [place]     Psychiatry:  Antidepressants - SSRI Failed - 01/21/2019  8:27 PM      Failed - Valid encounter within last 6 months    Recent Outpatient Visits          1 year ago Essential hypertension   Primary Care at [facility], [place], [place]   2 years ago Essential hypertension   Primary Care at [facility], [place], [place]   2 years ago Essential hypertension   Primary Care at [facility], [place], [place]   3 years ago Annual physical exam   [facility] at [facility], [place], [place]   3 years ago Essential hypertension   [facility] at [facility], [place], FNP             Failed - Completed PHQ-2 or PHQ-9 in the last 360 days.

## 2019-01-26 ENCOUNTER — Other Ambulatory Visit: Payer: Self-pay | Admitting: Emergency Medicine

## 2019-01-27 ENCOUNTER — Other Ambulatory Visit: Payer: Self-pay | Admitting: Adult Health

## 2019-01-29 ENCOUNTER — Telehealth: Payer: Self-pay

## 2019-01-29 NOTE — Telephone Encounter (Signed)
-----   Message from [name], NP sent at 01/27/2019  2:21 PM EDT ----- Regarding: Abid Lisinopril Good Afternoon Kenney Houseman, Can you please call Mr. Manzer's pharmacy and ensure that he is only receiving Lisinopril 10mg . Thanks! Valetta Fuller

## 2019-01-29 NOTE — Telephone Encounter (Signed)
-----   Message from [name], NP sent at 01/27/2019  2:21 PM EDT ----- Regarding: Renaldo Lisinopril Good Afternoon Kenney Houseman, Can you please call Mr. Lohmann's pharmacy and ensure that he is only receiving Lisinopril 10mg . Thanks! Valetta Fuller

## 2019-01-29 NOTE — Telephone Encounter (Signed)
THANK YOU! Valetta Fuller

## 2019-01-29 NOTE — Telephone Encounter (Signed)
Per pharmacist at [facility], new RX for 5mg  was sent on 01/27/2019 but the pt never filled.  Cancelled RX for 5mg  and pt last had 10mg  tabs filled on 12/18/2018.  Charyl Bigger, CMA

## 2019-03-25 NOTE — Progress Notes (Signed)
Virtual Visit via Telephone Note  I connected with Lenn Cal on 03/26/19 at  8:45 AM EST by telephone and verified that I am speaking with the correct person using two identifiers.  Location: Patient: Home Provider: In Clinic   I discussed the limitations, risks, security and privacy concerns of performing an evaluation and management service by telephone and the availability of in person appointments. I also discussed with the patient that there may be a patient responsible charge related to this service. The patient expressed understanding and agreed to proceed.   History of Present Illness: 09/26/2018 OV: Mr. Amenta calls in today for f/u: HTN and to discuss findings of recent imaging Your Lisinopril was increased from 5mg  to 10mg  once daily. Please check your blood pressure and heart rate daily. Please schedule a TeleMedicine visit in 2 weeks and provide these readings. Follow DASH diet, increase plain water intake. He reports home BP readings: SBP 110-140, has been in 110s consistently the last 4 days DBP 60-80 HR 50-70s He denies CP/dyspnea/HA/dizziness/palpitations He denies increase in fatigue He denies known first degree family hx of heart disease, heart block that required PPM, MI  09/17/2018 Neck US- FINDINGS: Sonographic evaluation of the right supraclavicular fossa was performed with additional evaluation of the left supraclavicular fossa for comparison.  No evidence of solid or cystic mass lesion. No circumscribed fatty lesion to suggest the presence of lipoma.  IMPRESSION: Negative sonographic evaluation of the right supraclavicular fossa. No mass or lesion identified.  03/26/2019 OV: Mr. Garald Braver is here for 6 month f/u: HTN He has not been checking BP/HR at home- denies acute cardiac sx's. He is currently on Lisinopril 10mg  QD. He has reduced soda intake- only consuming 1-2 Pepsi/day. He also consumes sweet tea, very little plain water  intake. 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  He has been experiencing post nasal gtt, body aches (worse in cervical neck), increase in overall fatiuge- sx's present for >3 weeks. He denies fever/night sweats/HA/N/V/D. He denies loss of sense of smell or taste. Several co-workers have had Ideal. Advised that he needs to be tested, follow quarantine guidelines.  01/18/2019 Labs:  CBC-WNL CMP-WNL A1c-WNL, 5.4 TSH-WNL, 0.785 Lipid Panel- The 10-year ASCVD risk score [name], et al., 2013) is: 4.5% Values used to calculate the score:  Age: 53 years  Sex: Male  Is Non-Hispanic African American: No  Diabetic: No  Tobacco smoker: No  Systolic Blood Pressure: 123456 mmHg  Is BP treated: Yes  HDL Cholesterol: 49 mg/dL  Total Cholesterol: 219 mg/dL LDL-142  Need to re-check lipid panel, if LDL not closer to 100, recommend increasing Atorvastatin dosage  Patient Care Team    Relationship Specialty Notifications Start End  [name], NP PCP - General Family Medicine  01/16/18     Patient Active Problem List   Diagnosis Date Noted  . Neck mass 09/13/2018  . Hyperlipidemia 03/14/2018  . Acute pain of right shoulder 03/14/2018  . Healthcare maintenance 01/17/2018  . Insomnia 01/17/2018  . OSA (obstructive sleep apnea) 06/06/2017  . Adenomatous colon polyp 04/29/2017  . Obsessive-compulsive disorder 01/07/2017  . Gout 03/30/2012  . HTN (hypertension) 03/27/2012  . Lipid disorder 03/27/2012     Past Medical History:  Diagnosis Date  . Allergy    seasonal  . Anxiety   . Heart murmur   . Hyperlipidemia   . Hypertension   . OCD (obsessive compulsive disorder)   . OSA (  obstructive sleep apnea) 06/06/2017     Past Surgical History:  Procedure Laterality Date  . carpal tunel  2012   left wrist   . CARPAL TUNNEL RELEASE  09/2010   left wrist  . VASECTOMY       Family History  Problem Relation  Age of Onset  . Hypertension Father   . Healthy Mother   . Healthy Brother   . Healthy Daughter   . Healthy Son   . Colon cancer Neg Hx   . Colon polyps Neg Hx   . Rectal cancer Neg Hx   . Stomach cancer Neg Hx      Social History   Substance and Sexual Activity  Drug Use No     Social History   Substance and Sexual Activity  Alcohol Use No  . Alcohol/week: 0.0 standard drinks     Social History   Tobacco Use  Smoking Status Former Smoker  . Packs/day: 0.50  . Years: 10.00  . Pack years: 5.00  . Types: Cigarettes  . Quit date: 06/29/1998  . Years since quitting: 20.7  Smokeless Tobacco Never Used     Outpatient Encounter Medications as of 03/26/2019  Medication Sig  . atorvastatin (LIPITOR) 20 MG tablet Take 1 tablet (20 mg total) by mouth daily. OFFICE VISIT REQUIRED PRIOR TO ANY FURTHER REFILLS  . lisinopril (ZESTRIL) 10 MG tablet TAKE 1 TABLET BY MOUTH EVERY DAY  . Melatonin 5 MG TABS Take by mouth at bedtime as needed.  Marland Kitchen PARoxetine (PAXIL) 30 MG tablet TAKE 1 TABLET BY MOUTH EVERY DAY  . [DISCONTINUED] atorvastatin (LIPITOR) 20 MG tablet TAKE 1 TABLET BY MOUTH EVERY DAY   No facility-administered encounter medications on file as of 03/26/2019.    Allergies: Patient has no known allergies.  There is no height or weight on file to calculate BMI.  There were no vitals taken for this visit. Review of Systems: General:   Denies fever, chills, unexplained weight loss.  Malaise +, Body Aches + Optho/Auditory:   Denies visual changes, blurred vision/LOV Respiratory:   Denies SOB, DOE more than baseline levels. Post nasal gtt +  Cardiovascular:   Denies chest pain, palpitations, new onset peripheral edema  Gastrointestinal:   Denies nausea, vomiting, diarrhea.  Genitourinary: Denies dysuria, freq/ urgency, flank pain or discharge from genitals.  Endocrine:     Denies hot or cold intolerance, polyuria, polydipsia. Musculoskeletal:   Denies unexplained  myalgias, joint swelling, unexplained arthralgias, gait problems.  Skin:  Denies rash, suspicious lesions Neurological:     Denies dizziness, unexplained weakness, numbness  Psychiatric/Behavioral:   Denies mood changes, suicidal or homicidal ideations, hallucinations  Observations/Objective: No acute distress noted during telephone conversation.  Assessment and Plan: Continue all medications as directed. Lipid panel in 2 weeks. Schedule appt ASAP for Corona Testing- follow quarantine guidelines. Increase water intake, strive for at leas 65 ounces/day.   Follow Heart Healthy diet Advised to check BP/HR at least twice a week Follow Up Instructions: Lipid panel in 2 weeks. OV 3 months   I discussed the assessment and treatment plan with the patient. The patient was provided an opportunity to ask questions and all were answered. The patient agreed with the plan and demonstrated an understanding of the instructions.   The patient was advised to call back or seek an in-person evaluation if the symptoms worsen or if the condition fails to improve as anticipated.  I provided 8 minutes of non-face-to-face time during this encounter.  [name], NP

## 2019-03-26 ENCOUNTER — Other Ambulatory Visit: Payer: Self-pay | Admitting: Adult Health

## 2019-03-26 ENCOUNTER — Telehealth: Payer: Self-pay

## 2019-03-26 ENCOUNTER — Other Ambulatory Visit: Payer: Self-pay

## 2019-03-26 ENCOUNTER — Ambulatory Visit (INDEPENDENT_AMBULATORY_CARE_PROVIDER_SITE_OTHER): Payer: Federal, State, Local not specified - PPO | Admitting: Adult Health

## 2019-03-26 ENCOUNTER — Encounter: Payer: Self-pay | Admitting: Adult Health

## 2019-03-26 DIAGNOSIS — E785 Hyperlipidemia, unspecified: Secondary | ICD-10-CM | POA: Diagnosis not present

## 2019-03-26 DIAGNOSIS — Z0000 Encounter for general adult medical examination without abnormal findings: Secondary | ICD-10-CM

## 2019-03-26 DIAGNOSIS — I10 Essential (primary) hypertension: Secondary | ICD-10-CM | POA: Diagnosis not present

## 2019-03-26 DIAGNOSIS — E789 Disorder of lipoprotein metabolism, unspecified: Secondary | ICD-10-CM

## 2019-03-26 NOTE — Assessment & Plan Note (Addendum)
Assessment and Plan: Continue all medications as directed. Lipid panel in 2 weeks. Schedule appt ASAP for Corona Testing- follow quarantine guidelines. Increase water intake, strive for at leas 65 ounces/day.   Follow Heart Healthy diet Advised to check BP/HR at least twice a week Follow Up Instructions: Lipid panel in 2 weeks. OV 3 months   I discussed the assessment and treatment plan with the patient. The patient was provided an opportunity to ask questions and all were answered. The patient agreed with the plan and demonstrated an understanding of the instructions.   The patient was advised to call back or seek an in-person evaluation if the symptoms worsen or if the condition fails to improve as anticipated.

## 2019-03-26 NOTE — Telephone Encounter (Signed)
Please call pt to schedule appt.  No further refills until pt is seen.  T. Taleshia Luff, CMA  

## 2019-03-26 NOTE — Assessment & Plan Note (Signed)
01/18/2019 Labs: Lipid Panel- The 10-year ASCVD risk score [name], et al., 2013) is: 4.5% Values used to calculate the score:  Age: 53 years  Sex: Male  Is Non-Hispanic African American: No  Diabetic: No  Tobacco smoker: No  Systolic Blood Pressure: 123456 mmHg  Is BP treated: Yes  HDL Cholesterol: 49 mg/dL  Total Cholesterol: 219 mg/dL LDL-142  Need to re-check lipid panel, if LDL not closer to 100, recommend increasing Atorvastatin dosage

## 2019-03-26 NOTE — Assessment & Plan Note (Addendum)
He has not been checking BP/HR at home- denies acute cardiac sx's. He is currently on Lisinopril 10mg  QD. Advised to check BP/HR at least twice a week

## 2019-03-27 ENCOUNTER — Ambulatory Visit: Payer: Federal, State, Local not specified - PPO | Attending: Internal Medicine

## 2019-03-27 ENCOUNTER — Other Ambulatory Visit: Payer: Self-pay

## 2019-03-27 DIAGNOSIS — Z20822 Contact with and (suspected) exposure to covid-19: Secondary | ICD-10-CM

## 2019-03-27 DIAGNOSIS — Z20828 Contact with and (suspected) exposure to other viral communicable diseases: Secondary | ICD-10-CM | POA: Diagnosis not present

## 2019-03-29 LAB — NOVEL CORONAVIRUS, NAA: SARS-CoV-2, NAA: NOT DETECTED

## 2019-04-09 ENCOUNTER — Other Ambulatory Visit: Payer: Federal, State, Local not specified - PPO

## 2019-04-09 ENCOUNTER — Other Ambulatory Visit: Payer: Self-pay

## 2019-04-09 DIAGNOSIS — E785 Hyperlipidemia, unspecified: Secondary | ICD-10-CM

## 2019-04-10 ENCOUNTER — Encounter: Payer: Self-pay | Admitting: Adult Health

## 2019-04-10 LAB — LIPID PANEL
Chol/HDL Ratio: 3.7 ratio (ref 0.0–5.0)
Cholesterol, Total: 178 mg/dL (ref 100–199)
HDL: 48 mg/dL (ref >39)
LDL Chol Calc (NIH): 112 mg/dL — ABNORMAL HIGH (ref 0–99)
Triglycerides: 99 mg/dL (ref 0–149)
VLDL Cholesterol Cal: 18 mg/dL (ref 5–40)

## 2019-04-22 ENCOUNTER — Other Ambulatory Visit: Payer: Self-pay | Admitting: Adult Health

## 2019-04-22 DIAGNOSIS — F429 Obsessive-compulsive disorder, unspecified: Secondary | ICD-10-CM

## 2019-04-24 ENCOUNTER — Other Ambulatory Visit: Payer: Self-pay | Admitting: Adult Health

## 2019-04-24 DIAGNOSIS — E789 Disorder of lipoprotein metabolism, unspecified: Secondary | ICD-10-CM

## 2019-04-29 NOTE — Progress Notes (Signed)
Received Epic notification that pt has not read MyChart message regarding results.     Recent Cholesterol Panel  From  [name], NP To  Paul Carson "Paul Carson" Sent and Delivered  04/10/2019 6:03 PM  Good Evening [name],  Your cholesterol panel has improved!  LDL reduced from 142 to 112!  Continue current statin dosage, follow heart healthy diet.  Recommend checking cholesterol every 7months.  Sincerely,  [employer] User Last Read On  KIM BOLON "Paul Carson" Not Read     Letter mailed to pt.  Charyl Bigger, CMA

## 2019-06-07 ENCOUNTER — Other Ambulatory Visit: Payer: Self-pay | Admitting: Adult Health

## 2019-06-07 DIAGNOSIS — I10 Essential (primary) hypertension: Secondary | ICD-10-CM

## 2019-06-21 ENCOUNTER — Telehealth: Payer: Self-pay | Admitting: Adult Health

## 2019-06-21 DIAGNOSIS — J3489 Other specified disorders of nose and nasal sinuses: Secondary | ICD-10-CM | POA: Diagnosis not present

## 2019-06-21 DIAGNOSIS — J029 Acute pharyngitis, unspecified: Secondary | ICD-10-CM | POA: Diagnosis not present

## 2019-06-21 DIAGNOSIS — Z20828 Contact with and (suspected) exposure to other viral communicable diseases: Secondary | ICD-10-CM | POA: Diagnosis not present

## 2019-06-21 DIAGNOSIS — M791 Myalgia, unspecified site: Secondary | ICD-10-CM | POA: Diagnosis not present

## 2019-06-21 NOTE — Telephone Encounter (Signed)
Patient called states he has been sick all week w/ Cold Symptoms (they seem to lessen in day then return w/ severity @ evening/ nite.  -- advised pt our office closes on Fridays @ 12noon & no medical staff available to speak w/him. (clarified that I am NOT clinical) but would advise him to seek medical treatment or at lease COVID testing to rule that out.(gv him [place] testing site information)   --- FYI to med asst.  --glh

## 2019-07-08 ENCOUNTER — Other Ambulatory Visit: Payer: Self-pay | Admitting: Adult Health

## 2019-07-08 DIAGNOSIS — E789 Disorder of lipoprotein metabolism, unspecified: Secondary | ICD-10-CM

## 2019-07-08 DIAGNOSIS — F429 Obsessive-compulsive disorder, unspecified: Secondary | ICD-10-CM

## 2019-07-11 ENCOUNTER — Other Ambulatory Visit: Payer: Self-pay | Admitting: Adult Health

## 2019-07-11 DIAGNOSIS — E789 Disorder of lipoprotein metabolism, unspecified: Secondary | ICD-10-CM

## 2019-07-11 DIAGNOSIS — F429 Obsessive-compulsive disorder, unspecified: Secondary | ICD-10-CM

## 2019-07-15 ENCOUNTER — Telehealth: Payer: Self-pay | Admitting: Adult Health

## 2019-07-15 ENCOUNTER — Other Ambulatory Visit: Payer: Self-pay | Admitting: Family Medicine

## 2019-07-15 DIAGNOSIS — E789 Disorder of lipoprotein metabolism, unspecified: Secondary | ICD-10-CM

## 2019-07-15 DIAGNOSIS — F429 Obsessive-compulsive disorder, unspecified: Secondary | ICD-10-CM

## 2019-07-15 MED ORDER — PAROXETINE HCL 30 MG PO TABS: 30.0000 mg | ORAL_TABLET | Freq: Every day | ORAL | 0 refills | Status: DC

## 2019-07-15 MED ORDER — ATORVASTATIN CALCIUM 20 MG PO TABS: 20.0000 mg | ORAL_TABLET | Freq: Every day | ORAL | 0 refills | Status: DC

## 2019-07-15 NOTE — Telephone Encounter (Signed)
Please call pt to schedule appt.  No further refills until pt is seen.  T. Kian Ottaviano, CMA  

## 2019-07-15 NOTE — Addendum Note (Signed)
Addended by: Fonnie Mu on: 07/15/2019 11:32 AM   Modules accepted: Orders

## 2019-07-15 NOTE — Telephone Encounter (Signed)
Patient is requesting a refill of his Paxil and atorvastatin. If approved please send to [facility] in [place]

## 2019-07-19 DIAGNOSIS — M109 Gout, unspecified: Secondary | ICD-10-CM | POA: Diagnosis not present

## 2019-07-19 DIAGNOSIS — R5383 Other fatigue: Secondary | ICD-10-CM | POA: Diagnosis not present

## 2019-07-19 DIAGNOSIS — R52 Pain, unspecified: Secondary | ICD-10-CM | POA: Diagnosis not present

## 2019-07-19 DIAGNOSIS — J302 Other seasonal allergic rhinitis: Secondary | ICD-10-CM | POA: Diagnosis not present

## 2019-08-15 ENCOUNTER — Ambulatory Visit (INDEPENDENT_AMBULATORY_CARE_PROVIDER_SITE_OTHER): Payer: Federal, State, Local not specified - PPO | Admitting: Physician Assistant

## 2019-08-15 ENCOUNTER — Other Ambulatory Visit: Payer: Self-pay

## 2019-08-15 ENCOUNTER — Encounter: Payer: Self-pay | Admitting: Physician Assistant

## 2019-08-15 VITALS — BP 124/81 | HR 62 | Temp 97.7°F | Ht 67.0 in | Wt 177.7 lb

## 2019-08-15 DIAGNOSIS — I10 Essential (primary) hypertension: Secondary | ICD-10-CM

## 2019-08-15 DIAGNOSIS — F429 Obsessive-compulsive disorder, unspecified: Secondary | ICD-10-CM | POA: Diagnosis not present

## 2019-08-15 DIAGNOSIS — M1A9XX0 Chronic gout, unspecified, without tophus (tophi): Secondary | ICD-10-CM

## 2019-08-15 DIAGNOSIS — E785 Hyperlipidemia, unspecified: Secondary | ICD-10-CM

## 2019-08-15 DIAGNOSIS — E789 Disorder of lipoprotein metabolism, unspecified: Secondary | ICD-10-CM

## 2019-08-15 DIAGNOSIS — G4733 Obstructive sleep apnea (adult) (pediatric): Secondary | ICD-10-CM

## 2019-08-15 MED ORDER — ATORVASTATIN CALCIUM 20 MG PO TABS: 20.0000 mg | ORAL_TABLET | Freq: Every day | ORAL | 1 refills | Status: DC

## 2019-08-15 NOTE — Patient Instructions (Signed)

## 2019-08-15 NOTE — Progress Notes (Signed)
Established Patient Office Visit  Subjective:  Patient ID: Paul Carson, adult    DOB: February 13, 1966  Age: 54 y.o. MRN: [id number]  CC:  Chief Complaint  Patient presents with  . Hyperlipidemia  . Hypertension  . mood management    HPI Paul Carson presents for follow-up on hypertension, hyperlipidemia and mood management.   HTN: Pt denies chest pain, palpitations, dizziness or leg swelling. Taking medication as directed without side effects. Checks BP at home occasionally and readings range in 130s-140s/70s-80s. Pt follows a low salt diet.  HLD: Pt taking medication as directed without issues. Denies side effects including myalgias and RUQ pain. Pt reports he has changed his diet and has reduced hamburgers and eliminated bacon.   Mood, Obsessive-compulsive disorder: Pt reports he has been on Paxil for about 30 years and has been effective.   Gout: Pt denies current symptoms and reports his flare-ups have decreased since he stopped eating bacon and reduced red meats.   OSA: Pt hasn't been using his CPAP machine for a while and reports daytime sleepiness and fatigue. States he didn't like the device and would find it challenging to sleep at night with it.   Past Medical History:  Diagnosis Date  . Allergy    seasonal  . Anxiety   . Heart murmur   . Hyperlipidemia   . Hypertension   . OCD (obsessive compulsive disorder)   . OSA (obstructive sleep apnea) 06/06/2017    Past Surgical History:  Procedure Laterality Date  . carpal tunel  2012   left wrist   . CARPAL TUNNEL RELEASE  09/2010   left wrist  . VASECTOMY      Family History  Problem Relation Age of Onset  . Hypertension Father   . Healthy Mother   . Healthy Brother   . Healthy Daughter   . Healthy Son   . Colon cancer Neg Hx   . Colon polyps Neg Hx   . Rectal cancer Neg Hx   . Stomach cancer Neg Hx     Social History   Socioeconomic History  . Marital status: Married    Spouse name: Ivin Booty   . Number of children: 2  . Years of education: college  . Highest education level: Bachelor's degree (e.g., BA, AB, BS)  Occupational History  . 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 Smoking status: Former Smoker    Packs/day: 0.50    Years: 10.00    Pack years: 5.00    Types: Cigarettes    Quit date: 06/29/1998    Years since quitting: 21.1  . Smokeless tobacco: Never Used  Substance and Sexual Activity  . Alcohol use: No    Alcohol/week: 0.0 standard drinks  . Drug use: No  . Sexual activity: Yes    Birth control/protection: Surgical  Other Topics Concern  . Not on file  Social History Narrative   Lives with his wife and children.   Smoke detectors in home? Yes    Guns in home? No    Consistent seatbelt use? Yes    Consistent dental brushing? Yes Flossing? No   Semi-Annual dental visits: Yes    Annual Eye exams: Yes    Social Determinants of Health   Financial Resource Strain:   . Difficulty of Paying Living Expenses:   Food Insecurity:   . Worried About Charity fundraiser in the Last Year:   . Arboriculturist in the Last Year:   [employer]  Needs:   . Lack of Transportation (Medical):   Marland Kitchen Lack of Transportation (Non-Medical):   Physical Activity:   . Days of Exercise per Week:   . Minutes of Exercise per Session:   Stress:   . Feeling of Stress :   Social Connections:   . Frequency of Communication with Friends and Family:   . Frequency of Social Gatherings with Friends and Family:   . Attends Religious Services:   . Active Member of Clubs or Organizations:   . Attends Archivist Meetings:   Marland Kitchen Marital Status:   Intimate Partner Violence:   . Fear of Current or Ex-Partner:   . Emotionally Abused:   Marland Kitchen Physically Abused:   . Sexually Abused:     Outpatient Medications Prior to Visit  Medication Sig Dispense Refill  . atorvastatin (LIPITOR) 20 MG tablet Take 1 tablet (20 mg total) by mouth daily. 30 tablet 0  . lisinopril (ZESTRIL) 10  MG tablet TAKE 1 TABLET BY MOUTH EVERY DAY 90 tablet 0  . Melatonin 5 MG TABS Take by mouth at bedtime as needed.    Marland Kitchen PARoxetine (PAXIL) 30 MG tablet Take 1 tablet (30 mg total) by mouth daily. 30 tablet 0   No facility-administered medications prior to visit.    No Known Allergies  ROS Review of Systems  Review of Systems:  A fourteen system review of systems was performed and found to be positive as per HPI.   Objective:    Physical Exam   General:  Well Developed, well nourished, appropriate for stated age.  Neuro:  Alert and oriented,  extra-ocular muscles intact  HEENT:  Normocephalic, atraumatic, neck supple, no carotid bruits appreciated  Skin:  no gross rash, warm, pink. Cardiac:  RRR Respiratory:  ECTA B/L and A/P, Not using accessory muscles, speaking in full sentences- unlabored. Vascular:  Ext warm, no cyanosis apprec.; cap RF less 2 sec. Psych:  No HI/SI, judgement and insight good, Euthymic mood. Full Affect.   BP 124/81   Pulse 62   Temp 97.7 F (36.5 C) (Oral)   Ht 5\' 7"  (1.702 m)   Wt 177 lb 11.2 oz (80.6 kg)   SpO2 96%   BMI 27.83 kg/m  Wt Readings from Last 3 Encounters:  08/15/19 177 lb 11.2 oz (80.6 kg)  09/26/18 170 lb (77.1 kg)  09/13/18 181 lb 8 oz (82.3 kg)     Health Maintenance Due  Topic Date Due  . COVID-19 Vaccine (1) Never done  . PAP SMEAR-Modifier  Never done  . MAMMOGRAM  Never done    There are no preventive care reminders to display for this patient.  Lab Results  Component Value Date   TSH 0.785 01/17/2018   Lab Results  Component Value Date   WBC 5.9 01/17/2018   HGB 16.5 01/17/2018   HCT 48.2 01/17/2018   MCV 86 01/17/2018   PLT 331 01/17/2018   Lab Results  Component Value Date   NA 141 01/17/2018   K 4.5 01/17/2018   CO2 20 01/17/2018   GLUCOSE 101 (H) 01/17/2018   BUN 14 01/17/2018   CREATININE 0.99 01/17/2018   BILITOT 0.7 01/17/2018   ALKPHOS 47 01/17/2018   AST 24 01/17/2018   ALT 35 01/17/2018    PROT 6.8 01/17/2018   ALBUMIN 4.4 01/17/2018   CALCIUM 9.1 01/17/2018   Lab Results  Component Value Date   CHOL 178 04/09/2019   Lab Results  Component Value Date  HDL 48 04/09/2019   Lab Results  Component Value Date   LDLCALC 112 (H) 04/09/2019   Lab Results  Component Value Date   TRIG 99 04/09/2019   Lab Results  Component Value Date   CHOLHDL 3.7 04/09/2019   Lab Results  Component Value Date   HGBA1C 5.4 01/17/2018      Assessment & Plan:   Problem List Items Addressed This Visit      Cardiovascular and Mediastinum   HTN (hypertension) - Primary     Respiratory   OSA (obstructive sleep apnea)     Other   Hyperlipidemia (Chronic)   Gout   Obsessive-compulsive disorder     HTN: - BP today is 148/76 and repeat BP 124/81. - Continue Lisinopril 10 mg. - Instructed patient to check BP and HR at least 2 times/wk, preferably 3 times for the next 4 weeks and send a [facility] message with his readings. If BP consistently above goal (130/80) then will consider increasing Lisinopril to 20 mg. Pt verbalized understanding and is agreeable. - Continue DASH diet. - Encourage to stay as active as possible. - Will check CMP at next OV to monitor med therapy.  HLD: - Last lipid panel had improved, LDL reduced from 142 to 112. - Continue Atorvastatin 20 mg. - Continue heart healthy diet and stay as active as possible.  - Will repeat lipid panel and hepatic function at next OV in 3 months.  Obsessive-compulsive disorder: - stable - Continue Paxil 30 mg. - Will continue to monitor.  Gout: - Encouraged to continue red meat reduction as well as seafoodto help prevent recurrence.  - Will continue to monitor.  OSA: - Pt's daytime sleepiness and fatigue most likely related to his sleep apnea given patient has not been using his CPAP for a long period of time.  - Encouraged to restart CPAP machine and follow-up with Pulmonology to discuss if there are other types of  devices he would qualify for.     No orders of the defined types were placed in this encounter.   Follow-up: Return in about 3 months (around 11/15/2019) for HTN and HLD, FBW.    Lorrene Reid, PA-C

## 2019-08-20 ENCOUNTER — Other Ambulatory Visit: Payer: Self-pay | Admitting: Family Medicine

## 2019-08-20 DIAGNOSIS — F429 Obsessive-compulsive disorder, unspecified: Secondary | ICD-10-CM

## 2019-08-20 DIAGNOSIS — I10 Essential (primary) hypertension: Secondary | ICD-10-CM

## 2019-08-22 ENCOUNTER — Other Ambulatory Visit: Payer: Self-pay | Admitting: Physician Assistant

## 2019-08-22 DIAGNOSIS — I10 Essential (primary) hypertension: Secondary | ICD-10-CM

## 2019-08-22 DIAGNOSIS — F429 Obsessive-compulsive disorder, unspecified: Secondary | ICD-10-CM

## 2019-08-22 MED ORDER — LISINOPRIL 10 MG PO TABS: 10.0000 mg | ORAL_TABLET | Freq: Every day | ORAL | 0 refills | Status: DC

## 2019-08-22 MED ORDER — PAROXETINE HCL 30 MG PO TABS: 30.0000 mg | ORAL_TABLET | Freq: Every day | ORAL | 0 refills | Status: DC

## 2019-08-22 NOTE — Telephone Encounter (Signed)
Patient called states he gave provider wrong meds to refill @ his last OV 08/15/19  Pt needs refills on these medications :   1)----lisinopril (ZESTRIL) 10 MG tablet [id number]   Order Details Dose, Route, Frequency: As Directed  Dispense Quantity: 90 tablet Refills: 0       Sig: TAKE 1 TABLET BY MOUTH EVERY DAY       &   PARoxetine (PAXIL) 30 MG tablet [id number] DISCONTINUED  Order Details Dose, Route, Frequency: As Directed  Dispense Quantity: 90 tablet Refills: 0       Sig: TAKE 1 TABLET BY MOUTH EVERY DAY   ----Forwarding request to med asst to send refill order to :    [id number] [address], [place] - [address] [phone] (Phone) [phone] (Fax)   --glh

## 2019-08-22 NOTE — Telephone Encounter (Signed)
Refills sent to pharmacy. AS, CMA 

## 2019-10-16 ENCOUNTER — Encounter: Payer: Self-pay | Admitting: Physician Assistant

## 2019-10-16 ENCOUNTER — Telehealth: Payer: Self-pay | Admitting: Physician Assistant

## 2019-10-16 NOTE — Telephone Encounter (Signed)
Patient came into the office stating that he has a history of carpal tunnel in both wrist, and that every once in a while it will flare up and keep him out of work for the day. He was advised by his HR rep to request FMLA just as precautionary measure to keep his work status. Patient has dropped of forms and have been placed in CMA's box

## 2019-10-16 NOTE — Telephone Encounter (Signed)
There is no diagnosis of carpal tunnel in patient chart.   Patient only seen once by [facility] and this issue not discussed then.   Patient would need to schedule an apt to discuss issues with Herb Grays before any FMLA paperwork could be filled out.    Left message for patient to call back. AS, CMA

## 2019-10-16 NOTE — Telephone Encounter (Signed)
Forms given back to [place]. AS, CMA

## 2019-10-17 NOTE — Telephone Encounter (Signed)
Pt has appt scheduled for 10/21/2019 to review FMLA paperwork.  Charyl Bigger, CMA

## 2019-10-17 NOTE — Telephone Encounter (Signed)
Left message for patient to call back in regards to the below. AS< CMA

## 2019-10-21 ENCOUNTER — Encounter: Payer: Self-pay | Admitting: Physician Assistant

## 2019-10-21 ENCOUNTER — Other Ambulatory Visit: Payer: Self-pay

## 2019-10-21 ENCOUNTER — Ambulatory Visit (INDEPENDENT_AMBULATORY_CARE_PROVIDER_SITE_OTHER): Payer: Federal, State, Local not specified - PPO | Admitting: Physician Assistant

## 2019-10-21 VITALS — BP 114/73 | HR 68 | Temp 98.3°F | Ht 67.0 in | Wt 176.1 lb

## 2019-10-21 DIAGNOSIS — G5603 Carpal tunnel syndrome, bilateral upper limbs: Secondary | ICD-10-CM

## 2019-10-21 NOTE — Progress Notes (Signed)
Established Patient Office Visit  Subjective:  Patient ID: Paul Carson, adult    DOB: 02/11/1966  Age: 54 y.o. MRN: [id number]  CC:  Chief Complaint  Patient presents with  . Carpal Tunnel    HPI PROCTOR CARRIKER presents for discussion of carpal tunnel and FMLA forms. Pt reports he was diagnosed about 10 years ago, and had carpal tunnel release of left wrist. Reports right wrist was not severe enough to have surgery.  Recently his symptoms of pain and discomfort have become more persistent and has needed to call off work because he is not able to perform his job duties. Patient works at the [employer] and performs repetitive movements that involve his hands constantly flexing/extending. Reports his symptoms are usually manageable and has to limit exacerbating activities.  Past Medical History:  Diagnosis Date  . Allergy    seasonal  . Anxiety   . Heart murmur   . Hyperlipidemia   . Hypertension   . OCD (obsessive compulsive disorder)   . OSA (obstructive sleep apnea) 06/06/2017    Past Surgical History:  Procedure Laterality Date  . carpal tunel  2012   left wrist   . CARPAL TUNNEL RELEASE  09/2010   left wrist  . VASECTOMY      Family History  Problem Relation Age of Onset  . Hypertension Father   . Healthy Mother   . Healthy Brother   . Healthy Daughter   . Healthy Son   . Colon cancer Neg Hx   . Colon polyps Neg Hx   . Rectal cancer Neg Hx   . Stomach cancer Neg Hx     Social History   Socioeconomic History  . Marital status: Married    Spouse name: Ivin Booty  . Number of children: 2  . Years of education: college  . Highest education level: Bachelor's degree (e.g., BA, AB, BS)  Occupational History  . 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 Smoking status: Former Smoker    Packs/day: 0.50    Years: 10.00    Pack years: 5.00    Types: Cigarettes    Quit date: 06/29/1998    Years since quitting: 21.3  . Smokeless tobacco: Never Used  Vaping  Use  . Vaping Use: Never used  Substance and Sexual Activity  . Alcohol use: No    Alcohol/week: 0.0 standard drinks  . Drug use: No  . Sexual activity: Yes    Birth control/protection: Surgical  Other Topics Concern  . Not on file  Social History Narrative   Lives with his wife and children.   Smoke detectors in home? Yes    Guns in home? No    Consistent seatbelt use? Yes    Consistent dental brushing? Yes Flossing? No   Semi-Annual dental visits: Yes    Annual Eye exams: Yes    Social Determinants of Health   Financial Resource Strain:   . Difficulty of Paying Living Expenses:   Food Insecurity:   . Worried About Charity fundraiser in the Last Year:   . Arboriculturist in the Last Year:   Transportation Needs:   . Film/video editor (Medical):   Marland Kitchen Lack of Transportation (Non-Medical):   Physical Activity:   . Days of Exercise per Week:   . Minutes of Exercise per Session:   Stress:   . Feeling of Stress :   Social Connections:   . Frequency of Communication with Friends and  Family:   . Frequency of Social Gatherings with Friends and Family:   . Attends Religious Services:   . Active Member of Clubs or Organizations:   . Attends Archivist Meetings:   Marland Kitchen Marital Status:   Intimate Partner Violence:   . Fear of Current or Ex-Partner:   . Emotionally Abused:   Marland Kitchen Physically Abused:   . Sexually Abused:     Outpatient Medications Prior to Visit  Medication Sig Dispense Refill  . atorvastatin (LIPITOR) 20 MG tablet Take 1 tablet (20 mg total) by mouth daily. 90 tablet 1  . lisinopril (ZESTRIL) 10 MG tablet Take 1 tablet (10 mg total) by mouth daily. 90 tablet 0  . Melatonin 5 MG TABS Take by mouth at bedtime as needed.    Marland Kitchen PARoxetine (PAXIL) 30 MG tablet Take 1 tablet (30 mg total) by mouth daily. 90 tablet 0   No facility-administered medications prior to visit.    No Known Allergies  ROS Review of Systems  A fourteen system review of  systems was performed and found to be positive as per HPI.   Objective:    Physical Exam   General:  Well Developed, well nourished, appropriate for stated age.  Neuro:  Alert and oriented,  extra-ocular muscles intact  HEENT:  Normocephalic, atraumatic, neck supple, no carotid bruits appreciated  Skin:  no gross rash, warm, pink. Cardiac:  RRR, S1 S2 Respiratory:  ECTA B/L and A/P, Not using accessory muscles, speaking in full sentences- unlabored. MSK: Good ROM and strength of both wrist, +Phalen, negative Finkelstein and Tinel's sign  Vascular:  Ext warm, no cyanosis apprec.; cap RF less 2 sec. Psych:  No HI/SI, judgement and insight good, Euthymic mood. Full Affect.  BP 114/73   Pulse 68   Temp 98.3 F (36.8 C) (Oral)   Ht 5\' 7"  (1.702 m)   Wt 176 lb 1.6 oz (79.9 kg)   SpO2 97% Comment: on RA  BMI 27.58 kg/m  Wt Readings from Last 3 Encounters:  10/21/19 176 lb 1.6 oz (79.9 kg)  08/15/19 177 lb 11.2 oz (80.6 kg)  09/26/18 170 lb (77.1 kg)     Health Maintenance Due  Topic Date Due  . Hepatitis C Screening  Never done  . COVID-19 Vaccine (1) Never done  . PAP SMEAR-Modifier  Never done  . MAMMOGRAM  Never done    There are no preventive care reminders to display for this patient.  Lab Results  Component Value Date   TSH 0.785 01/17/2018   Lab Results  Component Value Date   WBC 5.9 01/17/2018   HGB 16.5 01/17/2018   HCT 48.2 01/17/2018   MCV 86 01/17/2018   PLT 331 01/17/2018   Lab Results  Component Value Date   NA 141 01/17/2018   K 4.5 01/17/2018   CO2 20 01/17/2018   GLUCOSE 101 (H) 01/17/2018   BUN 14 01/17/2018   CREATININE 0.99 01/17/2018   BILITOT 0.7 01/17/2018   ALKPHOS 47 01/17/2018   AST 24 01/17/2018   ALT 35 01/17/2018   PROT 6.8 01/17/2018   ALBUMIN 4.4 01/17/2018   CALCIUM 9.1 01/17/2018   Lab Results  Component Value Date   CHOL 178 04/09/2019   Lab Results  Component Value Date   HDL 48 04/09/2019   Lab Results   Component Value Date   LDLCALC 112 (H) 04/09/2019   Lab Results  Component Value Date   TRIG 99 04/09/2019   Lab Results  Component Value Date   CHOLHDL 3.7 04/09/2019   Lab Results  Component Value Date   HGBA1C 5.4 01/17/2018      Assessment & Plan:   Problem List Items Addressed This Visit    None    Visit Diagnoses    Bilateral carpal tunnel syndrome    -  Primary     B/L carpal tunnel syndrome: -Continue supportive care including reducing provocative activities during flareups. -Recommend to use anti-inflammatories as needed for pain relief. -Recommend referral to hand/orthopedic specialist if symptoms worsen. -Will notify pt when FMLA forms are completed.   No orders of the defined types were placed in this encounter.   Follow-up: Return if symptoms worsen or fail to improve.    Lorrene Reid, PA-C

## 2019-10-23 ENCOUNTER — Telehealth: Payer: Self-pay | Admitting: Physician Assistant

## 2019-10-23 NOTE — Telephone Encounter (Signed)
Patient had OV earlier this week to discuss FMLA forms for carpel tunnel flare ups that can sometimes hinder his ability to work. He was advised to call back and let us know the dates he missed, he contacted his boos/HR dept and they said he was ok for the missed days but wants him to have FMLA moving forward to protect himself for any future episodes.

## 2019-11-13 ENCOUNTER — Other Ambulatory Visit: Payer: Self-pay | Admitting: Physician Assistant

## 2019-11-13 DIAGNOSIS — I10 Essential (primary) hypertension: Secondary | ICD-10-CM

## 2019-11-14 ENCOUNTER — Telehealth: Payer: Self-pay | Admitting: Physician Assistant

## 2019-11-14 NOTE — Telephone Encounter (Signed)
Please review and advise. FMLA paper work placed in [employer]. AS< CMA

## 2019-11-14 NOTE — Telephone Encounter (Signed)
Patient is stating FMLA form needs it to say how many occurences he can have per month (how many times he can be out per month or per week).

## 2019-11-14 NOTE — Telephone Encounter (Signed)
Patient came in a few weeks ago for [facility] paperwork for bilateral carpel tunnel that sometimes hinders his ability to work. He said the papers he got from our office was for the days missed but needs an open ended one per his HR dept to cover him for any future needs. He has dropped off additional paperwork that has been placed in CMA box.

## 2019-11-21 ENCOUNTER — Other Ambulatory Visit: Payer: Self-pay

## 2019-11-21 ENCOUNTER — Ambulatory Visit: Payer: Federal, State, Local not specified - PPO | Admitting: Physician Assistant

## 2019-11-21 ENCOUNTER — Encounter: Payer: Self-pay | Admitting: Physician Assistant

## 2019-11-21 VITALS — BP 133/78 | HR 62 | Temp 98.4°F | Ht 67.0 in | Wt 174.6 lb

## 2019-11-21 DIAGNOSIS — G4700 Insomnia, unspecified: Secondary | ICD-10-CM

## 2019-11-21 DIAGNOSIS — E785 Hyperlipidemia, unspecified: Secondary | ICD-10-CM

## 2019-11-21 DIAGNOSIS — I10 Essential (primary) hypertension: Secondary | ICD-10-CM

## 2019-11-21 DIAGNOSIS — F429 Obsessive-compulsive disorder, unspecified: Secondary | ICD-10-CM | POA: Diagnosis not present

## 2019-11-21 DIAGNOSIS — M25511 Pain in right shoulder: Secondary | ICD-10-CM

## 2019-11-21 DIAGNOSIS — G8929 Other chronic pain: Secondary | ICD-10-CM

## 2019-11-21 MED ORDER — PAROXETINE HCL 30 MG PO TABS: 30.0000 mg | ORAL_TABLET | Freq: Every day | ORAL | 1 refills | Status: DC

## 2019-11-21 NOTE — Assessment & Plan Note (Addendum)
-   BP stable. - Continue current medication regimen. - Continue ambulatory BP and pulse monitoring. - Follow DASH diet. - Stay well hydrated, at least 64 fl oz

## 2019-11-21 NOTE — Patient Instructions (Addendum)
Insomnia Insomnia is a sleep disorder that makes it difficult to fall asleep or stay asleep. Insomnia can cause fatigue, low energy, difficulty concentrating, mood swings, and poor performance at work or school. There are three different ways to classify insomnia:  Difficulty falling asleep.  Difficulty staying asleep.  Waking up too early in the morning. Any type of insomnia can be long-term (chronic) or short-term (acute). Both are common. Short-term insomnia usually lasts for three months or less. Chronic insomnia occurs at least three times a week for longer than three months. What are the causes? Insomnia may be caused by another condition, situation, or substance, such as:  Anxiety.  Certain medicines.  Gastroesophageal reflux disease (GERD) or other gastrointestinal conditions.  Asthma or other breathing conditions.  Restless legs syndrome, sleep apnea, or other sleep disorders.  Chronic pain.  Menopause.  Stroke.  Abuse of alcohol, tobacco, or illegal drugs.  Mental health conditions, such as depression.  Caffeine.  Neurological disorders, such as Alzheimer's disease.  An overactive thyroid (hyperthyroidism). Sometimes, the cause of insomnia may not be known. What increases the risk? Risk factors for insomnia include:  Gender. Women are affected more often than men.  Age. Insomnia is more common as you get older.  Stress.  Lack of exercise.  Irregular work schedule or working night shifts.  Traveling between different time zones.  Certain medical and mental health conditions. What are the signs or symptoms? If you have insomnia, the main symptom is having trouble falling asleep or having trouble staying asleep. This may lead to other symptoms, such as:  Feeling fatigued or having low energy.  Feeling nervous about going to sleep.  Not feeling rested in the morning.  Having trouble concentrating.  Feeling irritable, anxious, or depressed. How  is this diagnosed? This condition may be diagnosed based on:  Your symptoms and medical history. Your health care provider may ask about: ? Your sleep habits. ? Any medical conditions you have. ? Your mental health.  A physical exam. How is this treated? Treatment for insomnia depends on the cause. Treatment may focus on treating an underlying condition that is causing insomnia. Treatment may also include:  Medicines to help you sleep.  Counseling or therapy.  Lifestyle adjustments to help you sleep better. Follow these instructions at home: Eating and drinking   Limit or avoid alcohol, caffeinated beverages, and cigarettes, especially close to bedtime. These can disrupt your sleep.  Do not eat a large meal or eat spicy foods right before bedtime. This can lead to digestive discomfort that can make it hard for you to sleep. Sleep habits   Keep a sleep diary to help you and your health care provider figure out what could be causing your insomnia. Write down: ? When you sleep. ? When you wake up during the night. ? How well you sleep. ? How rested you feel the next day. ? Any side effects of medicines you are taking. ? What you eat and drink.  Make your bedroom a dark, comfortable place where it is easy to fall asleep. ? Put up shades or blackout curtains to block light from outside. ? Use a white noise machine to block noise. ? Keep the temperature cool.  Limit screen use before bedtime. This includes: ? Watching TV. ? Using your smartphone, tablet, or computer.  Stick to a routine that includes going to bed and waking up at the same times every day and night. This can help you fall asleep faster. Consider   making a quiet activity, such as reading, part of your nighttime routine.  Try to avoid taking naps during the day so that you sleep better at night.  Get out of bed if you are still awake after 15 minutes of trying to sleep. Keep the lights down, but try reading or  doing a quiet activity. When you feel sleepy, go back to bed. General instructions  Take over-the-counter and prescription medicines only as told by your health care provider.  Exercise regularly, as told by your health care provider. Avoid exercise starting several hours before bedtime.  Use relaxation techniques to manage stress. Ask your health care provider to suggest some techniques that may work well for you. These may include: ? Breathing exercises. ? Routines to release muscle tension. ? Visualizing peaceful scenes.  Make sure that you drive carefully. Avoid driving if you feel very sleepy.  Keep all follow-up visits as told by your health care provider. This is important. Contact a health care provider if:  You are tired throughout the day.  You have trouble in your daily routine due to sleepiness.  You continue to have sleep problems, or your sleep problems get worse. Get help right away if:  You have serious thoughts about hurting yourself or someone else. If you ever feel like you may hurt yourself or others, or have thoughts about taking your own life, get help right away. You can go to your nearest emergency department or call:  Your local emergency services (911 in the [place]  A suicide crisis helpline, such as the [place] at 586-658-8441. This is open 24 hours a day. Summary  Insomnia is a sleep disorder that makes it difficult to fall asleep or stay asleep.  Insomnia can be long-term (chronic) or short-term (acute).  Treatment for insomnia depends on the cause. Treatment may focus on treating an underlying condition that is causing insomnia.  Keep a sleep diary to help you and your health care provider figure out what could be causing your insomnia. This information is not intended to replace advice given to you by your health care provider. Make sure you discuss any questions you have with your health care provider. Document  Revised: 03/10/2017 Document Reviewed: 01/05/2017 Elsevier Patient Education  [place].    Shoulder Pain Many things can cause shoulder pain, including:  An injury to the shoulder.  Overuse of the shoulder.  Arthritis. The source of the pain can be:  Inflammation.  An injury to the shoulder joint.  An injury to a tendon, ligament, or bone. Follow these instructions at home: Pay attention to changes in your symptoms. Let your health care provider know about them. Follow these instructions to relieve your pain. If you have a sling:  Wear the sling as told by your health care provider. Remove it only as told by your health care provider.  Loosen the sling if your fingers tingle, become numb, or turn cold and blue.  Keep the sling clean.  If the sling is not waterproof: ? Do not let it get wet. Remove it to shower or bathe.  Move your arm as little as possible, but keep your hand moving to prevent swelling. Managing pain, stiffness, and swelling   If directed, put ice on the painful area: ? Put ice in a plastic bag. ? Place a towel between your skin and the bag. ? Leave the ice on for 20 minutes, 2-3 times per day. Stop applying ice if it  does not help with the pain.  Squeeze a soft ball or a foam pad as much as possible. This helps to keep the shoulder from swelling. It also helps to strengthen the arm. General instructions  Take over-the-counter and prescription medicines only as told by your health care provider.  Keep all follow-up visits as told by your health care provider. This is important. Contact a health care provider if:  Your pain gets worse.  Your pain is not relieved with medicines.  New pain develops in your arm, hand, or fingers. Get help right away if:  Your arm, hand, or fingers: ? Tingle. ? Become numb. ? Become swollen. ? Become painful. ? Turn white or blue. Summary  Shoulder pain can be caused by an injury, overuse, or  arthritis.  Pay attention to changes in your symptoms. Let your health care provider know about them.  This condition may be treated with a sling, ice, and pain medicines.  Contact your health care provider if the pain gets worse or new pain develops. Get help right away if your arm, hand, or fingers tingle or become numb, swollen, or painful.  Keep all follow-up visits as told by your health care provider. This is important. This information is not intended to replace advice given to you by your health care provider. Make sure you discuss any questions you have with your health care provider. Document Revised: 10/10/2017 Document Reviewed: 10/10/2017 Elsevier Patient Education  2020 [place].   Shoulder Impingement Syndrome  Shoulder impingement syndrome is a condition that causes pain when connective tissues (tendons) surrounding the shoulder joint become pinched. These tendons are part of the group of muscles and tissues that help to stabilize the shoulder (rotator cuff). Beneath the rotator cuff is a fluid-filled sac (bursa) that allows the muscles and tendons to glide smoothly. The bursa may become swollen or irritated (bursitis). Bursitis, swelling in the rotator cuff tendons, or both conditions can decrease how much space is under a bone in the shoulder joint (acromion), resulting in impingement. What are the causes? Shoulder impingement syndrome may be caused by bursitis or swelling of the rotator cuff tendons, which may result from:  Repetitive overhead arm movements.  Falling onto the shoulder.  Weakness in the shoulder muscles. What increases the risk? You may be more likely to develop this condition if you:  Play sports that involve throwing, such as baseball.  Participate in sports such as tennis, volleyball, and swimming.  Work as a Curator, Games developer, or Architect. Some people are also more likely to develop impingement syndrome because of the shape of their  acromion bone. What are the signs or symptoms? The main symptom of this condition is pain on the front or side of the shoulder. The pain may:  Get worse when lifting or raising the arm.  Get worse at night.  Wake you up from sleeping.  Feel sharp when the shoulder is moved and then fade to an ache. Other symptoms may include:  Tenderness.  Stiffness.  Inability to raise the arm above shoulder level or behind the body.  Weakness. How is this diagnosed? This condition may be diagnosed based on:  Your symptoms and medical history.  A physical exam.  Imaging tests, such as: ? X-rays. ? MRI. ? Ultrasound. How is this treated? This condition may be treated by:  Resting your shoulder and avoiding all activities that cause pain or put stress on the shoulder.  Icing your shoulder.  NSAIDs to help  reduce pain and swelling.  One or more injections of medicines to numb the area and reduce inflammation.  Physical therapy.  Surgery. This may be needed if nonsurgical treatments have not helped. Surgery may involve repairing the rotator cuff, reshaping the acromion, or removing the bursa. Follow these instructions at home: Managing pain, stiffness, and swelling   If directed, put ice on the injured area. ? Put ice in a plastic bag. ? Place a towel between your skin and the bag. ? Leave the ice on for 20 minutes, 2-3 times a day. Activity  Rest and return to your normal activities as told by your health care provider. Ask your health care provider what activities are safe for you.  Do exercises as told by your health care provider. General instructions  Do not use any products that contain nicotine or tobacco, such as cigarettes, e-cigarettes, and chewing tobacco. These can delay healing. If you need help quitting, ask your health care provider.  Ask your health care provider when it is safe for you to drive.  Take over-the-counter and prescription medicines only as  told by your health care provider.  Keep all follow-up visits as told by your health care provider. This is important. How is this prevented?  Give your body time to rest between periods of activity.  Be safe and responsible while being active. This will help you avoid falls.  Maintain physical fitness, including strength and flexibility. Contact a health care provider if:  Your symptoms have not improved after 1-2 months of treatment and rest.  You cannot lift your arm away from your body. Summary  Shoulder impingement syndrome is a condition that causes pain when connective tissues (tendons) surrounding the shoulder joint become pinched.  The main symptom of this condition is pain on the front or side of the shoulder.  This condition is usually treated with rest, ice, and pain medicines as needed. This information is not intended to replace advice given to you by your health care provider. Make sure you discuss any questions you have with your health care provider. Document Revised: 07/20/2018 Document Reviewed: 09/20/2017 Elsevier Patient Education  2020 [employer].

## 2019-11-21 NOTE — Progress Notes (Signed)
Established Patient Office Visit  Subjective:  Patient ID: Paul Carson, adult    DOB: Jun 12, 1965  Age: 54 y.o. MRN: [id number]  CC:  Chief Complaint  Patient presents with  . Hypertension  . Hyperlipidemia    HPI Paul Carson presents for follow-up on hypertension and hyperlipidemia. Pt complains of right shoulder pain x 6 months. States pain onset is with certain movements, especially with extension. It is difficult to find a comfortable position when sleeping at night. Denies injury or trauma. He hasn't taken anything to help with the pain.  HTN: Asymptomatic. Continues to check BP at home, and readings are usually <135/85. Taking medication as directed without issues.  HLD: Taking medication without side effects.   Insomnia: Continues to take melatonin, which helps with sleep.  Past Medical History:  Diagnosis Date  . Allergy    seasonal  . Anxiety   . Heart murmur   . Hyperlipidemia   . Hypertension   . OCD (obsessive compulsive disorder)   . OSA (obstructive sleep apnea) 06/06/2017    Past Surgical History:  Procedure Laterality Date  . carpal tunel  2012   left wrist   . CARPAL TUNNEL RELEASE  09/2010   left wrist  . VASECTOMY      Family History  Problem Relation Age of Onset  . Hypertension Father   . Healthy Mother   . Healthy Brother   . Healthy Daughter   . Healthy Son   . Colon cancer Neg Hx   . Colon polyps Neg Hx   . Rectal cancer Neg Hx   . Stomach cancer Neg Hx     Social History   Socioeconomic History  . Marital status: Married    Spouse name: Ivin Booty  . Number of children: 2  . Years of education: college  . Highest education level: Bachelor's degree (e.g., BA, AB, BS)  Occupational History  . 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 Smoking status: Former Smoker    Packs/day: 0.50    Years: 10.00    Pack years: 5.00    Types: Cigarettes    Quit date: 06/29/1998    Years since quitting: 21.4  . Smokeless tobacco:  Never Used  Vaping Use  . Vaping Use: Never used  Substance and Sexual Activity  . Alcohol use: No    Alcohol/week: 0.0 standard drinks  . Drug use: No  . Sexual activity: Yes    Birth control/protection: Surgical  Other Topics Concern  . Not on file  Social History Narrative   Lives with his wife and children.   Smoke detectors in home? Yes    Guns in home? No    Consistent seatbelt use? Yes    Consistent dental brushing? Yes Flossing? No   Semi-Annual dental visits: Yes    Annual Eye exams: Yes    Social Determinants of Health   Financial Resource Strain:   . Difficulty of Paying Living Expenses: Not on file  Food Insecurity:   . Worried About Charity fundraiser in the Last Year: Not on file  . Ran Out of Food in the Last Year: Not on file  Transportation Needs:   . Lack of Transportation (Medical): Not on file  . Lack of Transportation (Non-Medical): Not on file  Physical Activity:   . Days of Exercise per Week: Not on file  . Minutes of Exercise per Session: Not on file  Stress:   . Feeling of Stress : Not  on file  Social Connections:   . Frequency of Communication with Friends and Family: Not on file  . Frequency of Social Gatherings with Friends and Family: Not on file  . Attends Religious Services: Not on file  . Active Member of Clubs or Organizations: Not on file  . Attends Archivist Meetings: Not on file  . Marital Status: Not on file  Intimate Partner Violence:   . Fear of Current or Ex-Partner: Not on file  . Emotionally Abused: Not on file  . Physically Abused: Not on file  . Sexually Abused: Not on file    Outpatient Medications Prior to Visit  Medication Sig Dispense Refill  . atorvastatin (LIPITOR) 20 MG tablet Take 1 tablet (20 mg total) by mouth daily. 90 tablet 1  . lisinopril (ZESTRIL) 10 MG tablet TAKE 1 TABLET BY MOUTH EVERY DAY 90 tablet 0  . Melatonin 5 MG TABS Take by mouth at bedtime as needed.    Marland Kitchen PARoxetine (PAXIL) 30 MG  tablet Take 1 tablet (30 mg total) by mouth daily. 90 tablet 0   No facility-administered medications prior to visit.    No Known Allergies  ROS Review of Systems  A fourteen system review of systems was performed and found to be positive as per HPI.  Objective:    Physical Exam General:  Well Developed, well nourished, appropriate for stated age.  Neuro:  Alert and oriented,  extra-ocular muscles intact  HEENT:  Normocephalic, atraumatic, neck supple Skin:  no gross rash, warm, pink. Cardiac:  RRR, S1 S2 Respiratory:  ECTA B/L and A/P, Not using accessory muscles, speaking in full sentences- unlabored. MSK: Full shoulder ROM, good strength, pain with R shoulder abduction above 120 degrees. Negative Empty can test/Arc test, positive Hawkin's test Vascular:  Ext warm, no cyanosis apprec.; cap RF less 2 sec. Psych:  No HI/SI, judgement and insight good, Euthymic mood. Full Affect.  BP 133/78   Pulse 62   Temp 98.4 F (36.9 C) (Oral)   Ht _0  (1.702 m)   Wt 174 lb 9.6 oz (79.2 kg)   SpO2 97%   BMI 27.35 kg/m  Wt Readings from Last 3 Encounters:  11/21/19 174 lb 9.6 oz (79.2 kg)  10/21/19 176 lb 1.6 oz (79.9 kg)  08/15/19 177 lb 11.2 oz (80.6 kg)     Health Maintenance Due  Topic Date Due  . Hepatitis C Screening  Never done  . COVID-19 Vaccine (1) Never done  . PAP SMEAR-Modifier  Never done  . MAMMOGRAM  Never done  . INFLUENZA VACCINE  11/10/2019    There are no preventive care reminders to display for this patient.  Lab Results  Component Value Date   TSH 0.785 01/17/2018   Lab Results  Component Value Date   WBC 5.9 01/17/2018   HGB 16.5 01/17/2018   HCT 48.2 01/17/2018   MCV 86 01/17/2018   PLT 331 01/17/2018   Lab Results  Component Value Date   NA 140 11/21/2019   K 4.3 11/21/2019   CO2 23 11/21/2019   GLUCOSE 100 (H) 11/21/2019   BUN 12 11/21/2019   CREATININE 1.00 11/21/2019   BILITOT 0.8 11/21/2019   ALKPHOS 58 11/21/2019   AST 27  11/21/2019   ALT 36 11/21/2019   PROT 6.7 11/21/2019   ALBUMIN 4.1 11/21/2019   CALCIUM 9.0 11/21/2019   Lab Results  Component Value Date   CHOL 175 11/21/2019   Lab Results  Component Value  Date   HDL 45 11/21/2019   Lab Results  Component Value Date   LDLCALC 113 (H) 11/21/2019   Lab Results  Component Value Date   TRIG 94 11/21/2019   Lab Results  Component Value Date   CHOLHDL 3.9 11/21/2019   Lab Results  Component Value Date   HGBA1C 5.4 01/17/2018      Assessment & Plan:   Problem List Items Addressed This Visit      Cardiovascular and Mediastinum   HTN (hypertension) - Primary    - BP stable. - Continue current medication regimen. - Continue ambulatory BP and pulse monitoring. - Follow DASH diet. - Stay well hydrated, at least 64 fl oz         Relevant Orders   Comp Met (CMET) (Completed)     Other   Hyperlipidemia (Chronic)    - Last lipid panel improved, LDL 112 - Continue current medication regimen. - Follow heart healthy diet. - Rechecking lipid panel and hepatic function today.       Relevant Orders   Lipid Profile (Completed)   Comp Met (CMET) (Completed)   Obsessive-compulsive disorder   Relevant Medications   PARoxetine (PAXIL) 30 MG tablet   Insomnia    Other Visit Diagnoses    Chronic right shoulder pain       Relevant Medications   PARoxetine (PAXIL) 30 MG tablet     OCD: -Stable -Continue current medication regimen. Provided refill.  Insomnia: -Stable -Continue melatonin. -Encourage good sleep hygiene and relaxation techniques.  Chronic right shoulder pain: -Symptoms and mild pain with Hawkin's test suggest possible impingement syndrome.  -Recommend to take NSAIDs such as ibuprofen and apply topical anti-inflammatory or heat, avoid overuse, and shoulder exercises. -If symptoms fail to improve or worsen recommend imaging and/or Ortho referral. Pt verbalized understanding.   Meds ordered this encounter   Medications  . PARoxetine (PAXIL) 30 MG tablet    Sig: Take 1 tablet (30 mg total) by mouth daily.    Dispense:  90 tablet    Refill:  1    Order Specific Question:   Supervising Provider    Answer:   Beatrice Lecher D [[id number]    Follow-up: Return in about 4 months (around 03/22/2020) for CPE and FBW.    Lorrene Reid, PA-C

## 2019-11-21 NOTE — Assessment & Plan Note (Addendum)
-   Last lipid panel improved, LDL 112 - Continue current medication regimen. - Follow heart healthy diet. - Rechecking lipid panel and hepatic function today.

## 2019-11-22 LAB — COMPREHENSIVE METABOLIC PANEL
ALT: 36 IU/L (ref 0–44)
AST: 27 IU/L (ref 0–40)
Albumin/Globulin Ratio: 1.6 (ref 1.2–2.2)
Albumin: 4.1 g/dL (ref 3.8–4.9)
Alkaline Phosphatase: 58 IU/L (ref 48–121)
BUN/Creatinine Ratio: 12 (ref 9–20)
BUN: 12 mg/dL (ref 6–24)
Bilirubin Total: 0.8 mg/dL (ref 0.0–1.2)
CO2: 23 mmol/L (ref 20–29)
Calcium: 9 mg/dL (ref 8.7–10.2)
Chloride: 106 mmol/L (ref 96–106)
Creatinine, Ser: 1 mg/dL (ref 0.76–1.27)
GFR calc Af Amer: 99 mL/min/{1.73_m2} (ref >59)
GFR calc non Af Amer: 86 mL/min/{1.73_m2} (ref >59)
Globulin, Total: 2.6 g/dL (ref 1.5–4.5)
Glucose: 100 mg/dL — ABNORMAL HIGH (ref 65–99)
Potassium: 4.3 mmol/L (ref 3.5–5.2)
Sodium: 140 mmol/L (ref 134–144)
Total Protein: 6.7 g/dL (ref 6.0–8.5)

## 2019-11-22 LAB — LIPID PANEL
Chol/HDL Ratio: 3.9 ratio (ref 0.0–5.0)
Cholesterol, Total: 175 mg/dL (ref 100–199)
HDL: 45 mg/dL (ref >39)
LDL Chol Calc (NIH): 113 mg/dL — ABNORMAL HIGH (ref 0–99)
Triglycerides: 94 mg/dL (ref 0–149)
VLDL Cholesterol Cal: 17 mg/dL (ref 5–40)

## 2020-01-14 ENCOUNTER — Telehealth: Payer: Self-pay | Admitting: Physician Assistant

## 2020-01-14 DIAGNOSIS — M1A9XX0 Chronic gout, unspecified, without tophus (tophi): Secondary | ICD-10-CM

## 2020-01-14 MED ORDER — INDOMETHACIN 50 MG PO CAPS: 50.0000 mg | ORAL_CAPSULE | Freq: Three times a day (TID) | ORAL | 0 refills | Status: DC

## 2020-01-14 NOTE — Telephone Encounter (Signed)
I will send Indomethacin 50 mg TID (take with food) x 5 days. Patient can decrease dose to BID once his symptoms are improving.  Thank you, Herb Grays

## 2020-01-14 NOTE — Addendum Note (Signed)
Addended by: Mickel Crow on: 01/14/2020 04:32 PM   Modules accepted: Orders

## 2020-01-14 NOTE — Telephone Encounter (Signed)
Can we send in medicine for Gout?

## 2020-01-14 NOTE — Telephone Encounter (Signed)
Med sent to pharmacy. Left message to advise patient. AS, CMA

## 2020-01-14 NOTE — Telephone Encounter (Signed)
Patient has gout and would like to know how treat it.

## 2020-02-04 ENCOUNTER — Other Ambulatory Visit: Payer: Self-pay | Admitting: Physician Assistant

## 2020-02-04 DIAGNOSIS — I10 Essential (primary) hypertension: Secondary | ICD-10-CM

## 2020-02-12 ENCOUNTER — Other Ambulatory Visit: Payer: Self-pay | Admitting: Physician Assistant

## 2020-02-12 DIAGNOSIS — E789 Disorder of lipoprotein metabolism, unspecified: Secondary | ICD-10-CM

## 2020-02-12 DIAGNOSIS — E785 Hyperlipidemia, unspecified: Secondary | ICD-10-CM

## 2020-02-13 DIAGNOSIS — M1812 Unilateral primary osteoarthritis of first carpometacarpal joint, left hand: Secondary | ICD-10-CM | POA: Diagnosis not present

## 2020-03-23 ENCOUNTER — Other Ambulatory Visit: Payer: Self-pay

## 2020-03-23 ENCOUNTER — Encounter: Payer: Self-pay | Admitting: Physician Assistant

## 2020-03-23 ENCOUNTER — Ambulatory Visit (INDEPENDENT_AMBULATORY_CARE_PROVIDER_SITE_OTHER): Payer: Federal, State, Local not specified - PPO | Admitting: Physician Assistant

## 2020-03-23 VITALS — BP 122/76 | HR 63 | Ht 67.0 in | Wt 173.9 lb

## 2020-03-23 DIAGNOSIS — Z0000 Encounter for general adult medical examination without abnormal findings: Secondary | ICD-10-CM | POA: Diagnosis not present

## 2020-03-23 DIAGNOSIS — I10 Essential (primary) hypertension: Secondary | ICD-10-CM | POA: Diagnosis not present

## 2020-03-23 DIAGNOSIS — M1A9XX0 Chronic gout, unspecified, without tophus (tophi): Secondary | ICD-10-CM

## 2020-03-23 DIAGNOSIS — E785 Hyperlipidemia, unspecified: Secondary | ICD-10-CM | POA: Diagnosis not present

## 2020-03-23 DIAGNOSIS — Z23 Encounter for immunization: Secondary | ICD-10-CM

## 2020-03-23 NOTE — Progress Notes (Signed)
Male physical   Impression and Recommendations:    1. Healthcare maintenance   2. Essential hypertension   3. Hyperlipidemia, unspecified hyperlipidemia type   4. Need for shingles vaccine   5. Need for influenza vaccination   6. Chronic gout without tophus, unspecified cause, unspecified site      1) Anticipatory Guidance: Skin CA prevention- recommend to use sunscreen when outside along with skin surveillance; eating a balanced and modest diet; physical activity at least 25 minutes per day or minimum of 150 min/ week moderate to intense activity.  2) Immunizations / Screenings / Labs:   All immunizations are up-to-date per recommendations or will be updated today if pt allows.    - Patient understands with dental and vision screens they will schedule independently.  - Will obtain CBC, CMP, HgA1c, Lipid panel, TSH and PSA when fasting, if not already done past 12 mo/ recently  - UTD on tdap, colonoscopy, HIV screening. - Agreeable to Shingrix and Influenza vaccines. Completed Covid-19 series.  3) Weight:  Recommend to improve diet habits to improve overall feelings of well being and objective health data. Improve nutrient density of diet through increasing intake of fruits and vegetables and decreasing saturated fats, white flour products and refined sugars.   4) Healthcare Maintenance:  -Continue current medication regimen. -Discussed with patient gout prophylaxis. Patient has made dietary changes and noticed an improvement and reduction with gout attacks. If experiences increased gout exacerbations then recommend to start prophylaxis treatment. Patient verbalized understanding and agreeable. -Follow a heart healthy diet. -Increase water hydration. -Follow up in 4 months for HTN, HLD and mood management.    Orders Placed This Encounter  Procedures  . Varicella-zoster vaccine IM  . Flu Vaccine QUAD 6+ mos PF IM (Fluarix Quad PF)  . CBC  . Comprehensive metabolic panel     Order Specific Question:   Has the patient fasted?    Answer:   Yes  . Hemoglobin A1c  . TSH  . Lipid panel    Order Specific Question:   Has the patient fasted?    Answer:   Yes  . PSA    No orders of the defined types were placed in this encounter.    Return in about 4 months (around 07/22/2020) for HTN, HLD, mood.     Gross side effects, risk and benefits, and alternatives of medications discussed with patient.  Patient is aware that all medications have potential side effects and we are unable to predict every side effect or drug-drug interaction that may occur.  Expresses verbal understanding and consents to current therapy plan and treatment regimen.  Please see AVS handed out to patient at the end of our visit for further patient instructions/ counseling done pertaining to today's office visit.  Note:  This note was prepared with assistance of Dragon voice recognition software. Occasional wrong-word or sound-a-like substitutions may have occurred due to the inherent limitations of voice recognition software.      Subjective:        CC: CPE   HPI: Paul Carson is a 54 y.o. adult who presents to [place] at [facility] today for a yearly health maintenance exam.     Health Maintenance Summary  - Reviewed and updated, unless pt declines services.  Last Cologuard or Colonoscopy:   04/21/2017- repeat in 3 years Tobacco History Reviewed: Y, former smoker with 5 pck yr hx Alcohol / drug use:    No concerns,  no excessive use / no use Dental Home:  Y Eye exams:Y Dermatology home:N  Male history: STD concerns:   none, monogamous Additional penile/ urinary concerns:  none   Additional concerns beyond Health Maintenance issues:   2 gout flare-ups this year of left great toe.     Immunization History  Administered Date(s) Administered  . Influenza,inj,Quad PF,6+ Mos 03/25/2014, 04/13/2015, 03/29/2016, 01/03/2017, 03/14/2018  . Tdap  04/11/2008, 03/14/2018     Health Maintenance  Topic Date Due  . Hepatitis C Screening  Never done  . COVID-19 Vaccine (1) Never done  . INFLUENZA VACCINE  11/10/2019  . COLONOSCOPY  04/21/2020  . TETANUS/TDAP  03/14/2028  . HIV Screening  Completed  . MAMMOGRAM  Discontinued  . PAP SMEAR-Modifier  Discontinued       Wt Readings from Last 3 Encounters:  03/23/20 173 lb 14.4 oz (78.9 kg)  11/21/19 174 lb 9.6 oz (79.2 kg)  10/21/19 176 lb 1.6 oz (79.9 kg)   BP Readings from Last 3 Encounters:  03/23/20 122/76  11/21/19 133/78  10/21/19 114/73   Pulse Readings from Last 3 Encounters:  03/23/20 63  11/21/19 62  10/21/19 68    Patient Active Problem List   Diagnosis Date Noted  . Arthritis of carpometacarpal [facility]) joint of left thumb 02/13/2020  . Neck mass 09/13/2018  . Hyperlipidemia 03/14/2018  . Acute pain of right shoulder 03/14/2018  . Healthcare maintenance 01/17/2018  . Insomnia 01/17/2018  . OSA (obstructive sleep apnea) 06/06/2017  . Adenomatous colon polyp 04/29/2017  . Obsessive-compulsive disorder 01/07/2017  . Gout 03/30/2012  . HTN (hypertension) 03/27/2012  . Lipid disorder 03/27/2012    Past Medical History:  Diagnosis Date  . Allergy    seasonal  . Anxiety   . Heart murmur   . Hyperlipidemia   . Hypertension   . OCD (obsessive compulsive disorder)   . OSA (obstructive sleep apnea) 06/06/2017    Past Surgical History:  Procedure Laterality Date  . carpal tunel  2012   left wrist   . CARPAL TUNNEL RELEASE  09/2010   left wrist  . VASECTOMY      Family History  Problem Relation Age of Onset  . Hypertension Father   . Healthy Mother   . Healthy Brother   . Healthy Daughter   . Healthy Son   . Colon cancer Neg Hx   . Colon polyps Neg Hx   . Rectal cancer Neg Hx   . Stomach cancer Neg Hx     Social History   Substance and Sexual Activity  Drug Use No  ,  Social History   Substance and Sexual Activity  Alcohol Use No  .  Alcohol/week: 0.0 standard drinks  ,  Social History   Tobacco Use  Smoking Status Former Smoker  . Packs/day: 0.50  . Years: 10.00  . Pack years: 5.00  . Types: Cigarettes  . Quit date: 06/29/1998  . Years since quitting: 21.7  Smokeless Tobacco Never Used  ,  Social History   Substance and Sexual Activity  Sexual Activity Yes  . Birth control/protection: Surgical    Patient's Medications  New Prescriptions   No medications on file  Previous Medications   ATORVASTATIN (LIPITOR) 20 MG TABLET    TAKE 1 TABLET BY MOUTH EVERY DAY   INDOMETHACIN (INDOCIN) 50 MG CAPSULE    Take 1 capsule (50 mg total) by mouth 3 (three) times daily with meals.   LISINOPRIL (ZESTRIL) 10 MG TABLET  TAKE 1 TABLET BY MOUTH EVERY DAY   MELATONIN 5 MG TABS    Take by mouth at bedtime as needed.   PAROXETINE (PAXIL) 30 MG TABLET    Take 1 tablet (30 mg total) by mouth daily.  Modified Medications   No medications on file  Discontinued Medications   No medications on file    Patient has no known allergies.  Review of Systems: General:   Denies fever, chills, unexplained weight loss.  Optho/Auditory:   Denies visual changes, blurred vision/LOV Respiratory:   Denies SOB, DOE more than baseline levels.   Cardiovascular:   Denies chest pain, palpitations, new onset peripheral edema  Gastrointestinal:   Denies nausea, vomiting, diarrhea.  Genitourinary: Denies dysuria, freq/ urgency, flank pain or discharge from genitals.  Endocrine:     Denies hot or cold intolerance, polyuria, polydipsia. Musculoskeletal:   Denies unexplained myalgias, joint swelling, gait problems.  Skin:  Denies rash, suspicious lesions Neurological:     Denies dizziness, unexplained weakness, numbness  Psychiatric/Behavioral:   Denies mood changes, suicidal or homicidal ideations, hallucinations    Objective:     Blood pressure 122/76, pulse 63, height 5\' 7"  (1.702 m), weight 173 lb 14.4 oz (78.9 kg), SpO2 98 %. Body mass  index is 27.24 kg/m. General Appearance:    Alert, cooperative, no distress, appears stated age  Head:    Normocephalic, without obvious abnormality, atraumatic  Eyes:    PERRL (less of left eye), conjunctiva/corneas clear, EOM's intact, both eyes  Ears:    Normal TM's and external ear canals, both ears  Nose:   Nares normal, septum midline, mucosa normal, no drainage    or sinus tenderness  Throat:   Lips w/o lesion, mucosa moist, and tongue normal; teeth and gums normal  Neck:   Supple, symmetrical, trachea midline, no adenopathy; no   thyromegaly; no JVD  Back:     Symmetric, no curvature, ROM normal, no CVA tenderness  Lungs:     Clear to auscultation bilaterally, respirations unlabored, no  Wh/ R/ R  Chest Wall:    No tenderness or gross deformity; normal excursion   Heart:    Regular rate and rhythm, S1 and S2 normal, no murmur, rub   or gallop  Abdomen:     Soft, non-tender, bowel sounds active all four quadrants, No G/R/R, no masses, no organomegaly  Genitalia:   Deferred by pt.  Rectal:   Deferred by pt.   Extremities:   Extremities normal, atraumatic, no cyanosis or gross edema  Pulses:   2+ and symmetric all extremities  Skin:   Warm, dry, Skin color, texture, turgor normal, no obvious rashes or lesions  M-Sk:   Ambulates * 4 w/o difficulty, no gross deformities, tone WNL  Neurologic:   CNII-XII grossly intact, normal strength, sensation and reflexes    Throughout Psych:  No HI/SI, judgement and insight good, Euthymic mood. Full Affect.

## 2020-03-23 NOTE — Patient Instructions (Addendum)
Gout  Gout is painful swelling of your joints. Gout is a type of arthritis. It is caused by having too much uric acid in your body. Uric acid is a chemical that is made when your body breaks down substances called purines. If your body has too much uric acid, sharp crystals can form and build up in your joints. This causes pain and swelling. Gout attacks can happen quickly and be very painful (acute gout). Over time, the attacks can affect more joints and happen more often (chronic gout). What are the causes?  Too much uric acid in your blood. This can happen because: ? Your kidneys do not remove enough uric acid from your blood. ? Your body makes too much uric acid. ? You eat too many foods that are high in purines. These foods include organ meats, some seafood, and beer.  Trauma or stress. What increases the risk?  Having a family history of gout.  Being male and middle-aged.  Being male and having gone through menopause.  Being very overweight (obese).  Drinking alcohol, especially beer.  Not having enough water in the body (being dehydrated).  Losing weight too quickly.  Having an organ transplant.  Having lead poisoning.  Taking certain medicines.  Having kidney disease.  Having a skin condition called psoriasis. What are the signs or symptoms? An attack of acute gout usually happens in just one joint. The most common place is the big toe. Attacks often start at night. Other joints that may be affected include joints of the feet, ankle, knee, fingers, wrist, or elbow. Symptoms of an attack may include:  Very bad pain.  Warmth.  Swelling.  Stiffness.  Shiny, red, or purple skin.  Tenderness. The affected joint may be very painful to touch.  Chills and fever. Chronic gout may cause symptoms more often. More joints may be involved. You may also have white or yellow lumps (tophi) on your hands or feet or in other areas near your joints. How is this  treated?  Treatment for this condition has two phases: treating an acute attack and preventing future attacks.  Acute gout treatment may include: ? NSAIDs. ? Steroids. These are taken by mouth or injected into a joint. ? Colchicine. This medicine relieves pain and swelling. It can be given by mouth or through an IV tube.  Preventive treatment may include: ? Taking small doses of NSAIDs or colchicine daily. ? Using a medicine that reduces uric acid levels in your blood. ? Making changes to your diet. You may need to see a food expert (dietitian) about what to eat and drink to prevent gout. Follow these instructions at home: During a gout attack   If told, put ice on the painful area: ? Put ice in a plastic bag. ? Place a towel between your skin and the bag. ? Leave the ice on for 20 minutes, 2-3 times a day.  Raise (elevate) the painful joint above the level of your heart as often as you can.  Rest the joint as much as possible. If the joint is in your leg, you may be given crutches.  Follow instructions from your doctor about what you cannot eat or drink. Avoiding future gout attacks  Eat a low-purine diet. Avoid foods and drinks such as: ? Liver. ? Kidney. ? Anchovies. ? Asparagus. ? Herring. ? Mushrooms. ? Mussels. ? Beer.  Stay at a healthy weight. If you want to lose weight, talk with your doctor. Do not lose weight  too fast.  Start or continue an exercise plan as told by your doctor. Eating and drinking  Drink enough fluids to keep your pee (urine) pale yellow.  If you drink alcohol: ? Limit how much you use to:  0-1 drink a day for women.  0-2 drinks a day for men. ? Be aware of how much alcohol is in your drink. In the [place] one drink equals one 12 oz bottle of beer (355 mL), one 5 oz glass of wine (148 mL), or one 1 oz glass of hard liquor (44 mL). General instructions  Take over-the-counter and prescription medicines only as told by your doctor.  Do  not drive or use heavy machinery while taking prescription pain medicine.  Return to your normal activities as told by your doctor. Ask your doctor what activities are safe for you.  Keep all follow-up visits as told by your doctor. This is important. Contact a doctor if:  You have another gout attack.  You still have symptoms of a gout attack after 10 days of treatment.  You have problems (side effects) because of your medicines.  You have chills or a fever.  You have burning pain when you pee (urinate).  You have pain in your lower back or belly. Get help right away if:  You have very bad pain.  Your pain cannot be controlled.  You cannot pee. Summary  Gout is painful swelling of the joints.  The most common site of pain is the big toe, but it can affect other joints.  Medicines and avoiding some foods can help to prevent and treat gout attacks. This information is not intended to replace advice given to you by your health care provider. Make sure you discuss any questions you have with your health care provider. Document Revised: 10/18/2017 Document Reviewed: 10/18/2017 Elsevier Patient Education  2020 [employer]   Preventive Care 29-46 Years Old, Male Preventive care refers to visits with your health care provider and lifestyle choices that can promote health and wellness. This includes:  A yearly physical exam. This may also be called an annual well check.  Regular dental visits and eye exams.  Immunizations.  Screening for certain conditions.  Healthy lifestyle choices, such as eating a healthy diet, getting regular exercise, not using drugs or products that contain nicotine and tobacco, and limiting alcohol use. What can I expect for my preventive care visit? Physical exam Your health care provider will check your:  Height and weight. This may be used to calculate body mass index (BMI), which tells if you are at a healthy weight.  Heart rate and  blood pressure.  Skin for abnormal spots. Counseling Your health care provider may ask you questions about your:  Alcohol, tobacco, and drug use.  Emotional well-being.  Home and relationship well-being.  Sexual activity.  Eating habits.  Work and work Statistician.  Method of birth control.  Menstrual cycle.  Pregnancy history. What immunizations do I need?  Influenza (flu) vaccine  This is recommended every year. Tetanus, diphtheria, and pertussis (Tdap) vaccine  You may need a Td booster every 10 years. Varicella (chickenpox) vaccine  You may need this if you have not been vaccinated. Zoster (shingles) vaccine  You may need this after age 77. Measles, mumps, and rubella (MMR) vaccine  You may need at least one dose of MMR if you were born in 1957 or later. You may also need a second dose. Pneumococcal conjugate (PCV13) vaccine  You may need  this if you have certain conditions and were not previously vaccinated. Pneumococcal polysaccharide (PPSV23) vaccine  You may need one or two doses if you smoke cigarettes or if you have certain conditions. Meningococcal conjugate (MenACWY) vaccine  You may need this if you have certain conditions. Hepatitis A vaccine  You may need this if you have certain conditions or if you travel or work in places where you may be exposed to hepatitis A. Hepatitis B vaccine  You may need this if you have certain conditions or if you travel or work in places where you may be exposed to hepatitis B. Haemophilus influenzae type b (Hib) vaccine  You may need this if you have certain conditions. Human papillomavirus (HPV) vaccine  If recommended by your health care provider, you may need three doses over 6 months. You may receive vaccines as individual doses or as more than one vaccine together in one shot (combination vaccines). Talk with your health care provider about the risks and benefits of combination vaccines. What tests do I  need? Blood tests  Lipid and cholesterol levels. These may be checked every 5 years, or more frequently if you are over 28 years old.  Hepatitis C test.  Hepatitis B test. Screening  Lung cancer screening. You may have this screening every year starting at age 42 if you have a 30-pack-year history of smoking and currently smoke or have quit within the past 15 years.  Colorectal cancer screening. All adults should have this screening starting at age 71 and continuing until age 33. Your health care provider may recommend screening at age 31 if you are at increased risk. You will have tests every 1-10 years, depending on your results and the type of screening test.  Diabetes screening. This is done by checking your blood sugar (glucose) after you have not eaten for a while (fasting). You may have this done every 1-3 years.  Mammogram. This may be done every 1-2 years. Talk with your health care provider about when you should start having regular mammograms. This may depend on whether you have a family history of breast cancer.  BRCA-related cancer screening. This may be done if you have a family history of breast, ovarian, tubal, or peritoneal cancers.  Pelvic exam and Pap test. This may be done every 3 years starting at age 67. Starting at age 18, this may be done every 5 years if you have a Pap test in combination with an HPV test. Other tests  Sexually transmitted disease (STD) testing.  Bone density scan. This is done to screen for osteoporosis. You may have this scan if you are at high risk for osteoporosis. Follow these instructions at home: Eating and drinking  Eat a diet that includes fresh fruits and vegetables, whole grains, lean protein, and low-fat dairy.  Take vitamin and mineral supplements as recommended by your health care provider.  Do not drink alcohol if: ? Your health care provider tells you not to drink. ? You are pregnant, may be pregnant, or are planning to  become pregnant.  If you drink alcohol: ? Limit how much you have to 0-1 drink a day. ? Be aware of how much alcohol is in your drink. In the [place] one drink equals one 12 oz bottle of beer (355 mL), one 5 oz glass of wine (148 mL), or one 1 oz glass of hard liquor (44 mL). Lifestyle  Take daily care of your teeth and gums.  Stay active. Exercise for at  least 30 minutes on 5 or more days each week.  Do not use any products that contain nicotine or tobacco, such as cigarettes, e-cigarettes, and chewing tobacco. If you need help quitting, ask your health care provider.  If you are sexually active, practice safe sex. Use a condom or other form of birth control (contraception) in order to prevent pregnancy and STIs (sexually transmitted infections).  If told by your health care provider, take low-dose aspirin daily starting at age 75. What's next?  Visit your health care provider once a year for a well check visit.  Ask your health care provider how often you should have your eyes and teeth checked.  Stay up to date on all vaccines. This information is not intended to replace advice given to you by your health care provider. Make sure you discuss any questions you have with your health care provider. Document Revised: 12/07/2017 Document Reviewed: 12/07/2017 Elsevier Patient Education  2020 [employer].

## 2020-03-24 LAB — COMPREHENSIVE METABOLIC PANEL
ALT: 30 IU/L (ref 0–44)
AST: 25 IU/L (ref 0–40)
Albumin/Globulin Ratio: 1.4 (ref 1.2–2.2)
Albumin: 4.2 g/dL (ref 3.8–4.9)
Alkaline Phosphatase: 61 IU/L (ref 44–121)
BUN/Creatinine Ratio: 11 (ref 9–20)
BUN: 12 mg/dL (ref 6–24)
Bilirubin Total: 0.5 mg/dL (ref 0.0–1.2)
CO2: 23 mmol/L (ref 20–29)
Calcium: 9.3 mg/dL (ref 8.7–10.2)
Chloride: 105 mmol/L (ref 96–106)
Creatinine, Ser: 1.05 mg/dL (ref 0.76–1.27)
GFR calc Af Amer: 93 mL/min/{1.73_m2} (ref >59)
GFR calc non Af Amer: 81 mL/min/{1.73_m2} (ref >59)
Globulin, Total: 3.1 g/dL (ref 1.5–4.5)
Glucose: 95 mg/dL (ref 65–99)
Potassium: 4.5 mmol/L (ref 3.5–5.2)
Sodium: 141 mmol/L (ref 134–144)
Total Protein: 7.3 g/dL (ref 6.0–8.5)

## 2020-03-24 LAB — CBC
Hematocrit: 47 % (ref 37.5–51.0)
Hemoglobin: 16.3 g/dL (ref 13.0–17.7)
MCH: 30.1 pg (ref 26.6–33.0)
MCHC: 34.7 g/dL (ref 31.5–35.7)
MCV: 87 fL (ref 79–97)
Platelets: 329 10*3/uL (ref 150–450)
RBC: 5.41 x10E6/uL (ref 4.14–5.80)
RDW: 13.1 % (ref 11.6–15.4)
WBC: 4.9 10*3/uL (ref 3.4–10.8)

## 2020-03-24 LAB — LIPID PANEL
Chol/HDL Ratio: 3.8 ratio (ref 0.0–5.0)
Cholesterol, Total: 191 mg/dL (ref 100–199)
HDL: 50 mg/dL (ref >39)
LDL Chol Calc (NIH): 121 mg/dL — ABNORMAL HIGH (ref 0–99)
Triglycerides: 109 mg/dL (ref 0–149)
VLDL Cholesterol Cal: 20 mg/dL (ref 5–40)

## 2020-03-24 LAB — TSH: TSH: 1.73 u[IU]/mL (ref 0.450–4.500)

## 2020-03-24 LAB — HEMOGLOBIN A1C
Est. average glucose Bld gHb Est-mCnc: 117 mg/dL
Hgb A1c MFr Bld: 5.7 % — ABNORMAL HIGH (ref 4.8–5.6)

## 2020-03-24 LAB — PSA: Prostate Specific Ag, Serum: 0.5 ng/mL (ref 0.0–4.0)

## 2020-04-08 ENCOUNTER — Telehealth: Payer: Self-pay | Admitting: Physician Assistant

## 2020-04-08 DIAGNOSIS — U071 COVID-19: Secondary | ICD-10-CM | POA: Diagnosis not present

## 2020-04-08 NOTE — Telephone Encounter (Signed)
Home test was positive for Covid on 05/08/2019- Patient asked next steps, advised to visit the [facility] website, in response to his question of validity, I advised a PCR test.

## 2020-04-30 ENCOUNTER — Other Ambulatory Visit: Payer: Self-pay | Admitting: Physician Assistant

## 2020-04-30 DIAGNOSIS — I10 Essential (primary) hypertension: Secondary | ICD-10-CM

## 2020-05-11 ENCOUNTER — Other Ambulatory Visit: Payer: Self-pay | Admitting: Physician Assistant

## 2020-05-11 DIAGNOSIS — E789 Disorder of lipoprotein metabolism, unspecified: Secondary | ICD-10-CM

## 2020-05-11 DIAGNOSIS — E785 Hyperlipidemia, unspecified: Secondary | ICD-10-CM

## 2020-05-20 ENCOUNTER — Other Ambulatory Visit: Payer: Self-pay | Admitting: Physician Assistant

## 2020-05-20 DIAGNOSIS — F429 Obsessive-compulsive disorder, unspecified: Secondary | ICD-10-CM

## 2020-06-11 DIAGNOSIS — M19031 Primary osteoarthritis, right wrist: Secondary | ICD-10-CM | POA: Diagnosis not present

## 2020-06-11 DIAGNOSIS — M19032 Primary osteoarthritis, left wrist: Secondary | ICD-10-CM | POA: Diagnosis not present

## 2020-06-11 DIAGNOSIS — M1811 Unilateral primary osteoarthritis of first carpometacarpal joint, right hand: Secondary | ICD-10-CM | POA: Diagnosis not present

## 2020-06-11 DIAGNOSIS — M1812 Unilateral primary osteoarthritis of first carpometacarpal joint, left hand: Secondary | ICD-10-CM | POA: Diagnosis not present

## 2020-07-22 ENCOUNTER — Other Ambulatory Visit: Payer: Self-pay

## 2020-07-22 ENCOUNTER — Ambulatory Visit (INDEPENDENT_AMBULATORY_CARE_PROVIDER_SITE_OTHER): Payer: Federal, State, Local not specified - PPO | Admitting: Physician Assistant

## 2020-07-22 ENCOUNTER — Encounter: Payer: Self-pay | Admitting: Physician Assistant

## 2020-07-22 VITALS — BP 131/86 | HR 58 | Temp 97.5°F | Ht 67.0 in | Wt 181.2 lb

## 2020-07-22 DIAGNOSIS — Z23 Encounter for immunization: Secondary | ICD-10-CM

## 2020-07-22 DIAGNOSIS — M1812 Unilateral primary osteoarthritis of first carpometacarpal joint, left hand: Secondary | ICD-10-CM

## 2020-07-22 DIAGNOSIS — E785 Hyperlipidemia, unspecified: Secondary | ICD-10-CM | POA: Diagnosis not present

## 2020-07-22 DIAGNOSIS — G5603 Carpal tunnel syndrome, bilateral upper limbs: Secondary | ICD-10-CM

## 2020-07-22 DIAGNOSIS — I10 Essential (primary) hypertension: Secondary | ICD-10-CM

## 2020-07-22 DIAGNOSIS — F429 Obsessive-compulsive disorder, unspecified: Secondary | ICD-10-CM

## 2020-07-22 MED ORDER — PAROXETINE HCL 10 MG PO TABS: ORAL_TABLET | ORAL | 0 refills | Status: DC

## 2020-07-22 MED ORDER — SERTRALINE HCL 25 MG PO TABS: 25.0000 mg | ORAL_TABLET | Freq: Every day | ORAL | 1 refills | Status: DC

## 2020-07-22 NOTE — Patient Instructions (Signed)
Managing Obsessive-Compulsive Disorder If you have been diagnosed with obsessive-compulsive disorder (OCD), you may be relieved that you now know why you have felt or behaved a certain way. You may also feel overwhelmed about the treatment ahead, how to get the support you need, and how to deal with the condition day-to-day. With treatment and support, you can manage your OCD. How to manage lifestyle changes Managing stress Stress is your body's reaction to life changes and events, both good and bad. Stress can play a major role in OCD, so it is important to learn how to manage stress. Some techniques to manage stress include:  Meditation, muscle relaxation, and breathing exercises.  Exercise. Even a short daily walk can help to lower stress levels.  Getting enough good-quality sleep.  Spending time on hobbies that you enjoy.  Accepting and letting go of things that you cannot change. To help you manage stress associated with OCD, your health care provider may recommend exposure and response prevention therapy. In this therapy, you will be exposed to the distressing situation that triggers your compulsion and be prevented from responding to it. With repetition of this process over time, you will no longer feel the distress or need to perform the compulsion.   Medicines Your health care provider may suggest certain medicines for depression (antidepressants) if he or she feels that they will help to improve your condition. Avoid using alcohol and other substances that may prevent your medicines from working properly. It is also important to:  Talk with your pharmacist or health care provider about all medicines that you take, their possible side effects, and which medicines are safe to take together.  Make it your goal to take part in all treatment decisions (shared decision-making). Ask about possible side effects of medicines that your health care provider recommends, and tell him or her how you  feel about having those side effects. It is best if shared decision-making with your health care provider is part of your total treatment plan. If you are taking medicines as part of your treatment, do not stop taking them before you ask your health care provider if it is safe to stop. You may need to have the medicine slowly decreased (tapered) over time to lower the risk of harmful side effects. Relationships Consider giving education materials to friends and family. Your family and friends may need to learn about your OCD in order for them to understand you and support you as you manage your condition. Family therapy may also help to lower stress and relieve tension. How to recognize changes in your condition Some signs that your condition may be getting worse include:  Being anxious about germs or dirt.  Having harmful thoughts about yourself or others.  Making sure that household objects are alike or perfectly organized in a specific way.  Having great difficulty making decisions, or second-guessing yourself after making a decision.  Constant cleaning and handwashing.  Repeating behavior such as repeatedly checking to see if a door is locked or the oven is off.  Counting nonstop or uncontrollably. Follow these instructions at home: Medicines  Take over-the-counter and prescription medicines only as told by your health care provider.  Check with your health care provider before starting any new prescription or over-the-counter medicines. General instructions  Ask for support from trusted family members or friends to make sure you stay on track with your treatment.  Keep a journal to write down your daily moods, medicines, sleep habits, and life events.  Doing this may help you have more success with your treatment.  Maintain a healthy lifestyle. Eat a healthy diet, exercise regularly, get plenty of sleep, and take time to relax.  Keep all follow-up visits as told by your health  care provider and therapist. This is important.   Where to find support Talking to others It may be difficult to tell loved ones about your condition, but they can be a good support system for you. You can work with your therapist to decide whom to tell and when to tell them. Here are some tips for starting the conversation:  Start by sharing your experience with OCD. It is up to you how much detail you want to provide.  Let your loved ones know that you are seeking treatment.  Do not expect loved ones to understand your condition right away. Finances Not all insurance plans cover mental health care, so it is important to check with your insurance carrier. If paying for co-pays or counseling services is a problem, search for a local or county mental health care center. Public mental health care services may be offered there at a low cost or no cost when you are not able to see a private health care provider. If you are taking medicine for depression, you may be able to get the generic form, which may be less expensive than brand-name medicine. Some makers of prescription medicines also offer help to patients who cannot afford the medicines they need. Questions to ask your health care provider  If you are taking medicines: ? How long do I need to take medicine? ? Are there any long-term side effects of my medicine? ? Are there any alternatives to taking medicine?  How would I benefit from therapy?  How often should I follow up with a health care provider? Where to find more information  [employer]: www.iocdf.[place] [employer] (NAMI): www.nami.org Contact a health care provider if:  Your symptoms get worse or they do not get better with treatment.  You develop new symptoms. Get help right away if:  You have severe side effects after taking your medicine.  You have thoughts about hurting yourself or others. If you ever feel like you may hurt  yourself or others, or have thoughts about taking your own life, get help right away. You can go to your nearest emergency department or call:  Your local emergency services (911 in the [place]  A suicide crisis helpline, such as the [facility] at (803)858-4579. This is open 24 hours a day. Summary  Stress can play a major role in obsessive-compulsive disorder (OCD). Learning ways to manage stress may help your treatment work better for you.  If you are taking medicines as part of your treatment, do not stop taking medicines before you ask your health care provider if it is safe to stop.  When talking with family members and friends about your OCD, decide how much detail you want to give them and be patient as they work to understand your condition.  Keep all follow-up visits as told by your health care provider and therapist. This is important. This information is not intended to replace advice given to you by your health care provider. Make sure you discuss any questions you have with your health care provider. Document Revised: 01/17/2020 Document Reviewed: 12/12/2019 Elsevier Patient Education  2021 [employer].
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
## 2020-07-26 ENCOUNTER — Other Ambulatory Visit: Payer: Self-pay | Admitting: Physician Assistant

## 2020-07-26 DIAGNOSIS — I10 Essential (primary) hypertension: Secondary | ICD-10-CM

## 2020-07-27 ENCOUNTER — Other Ambulatory Visit: Payer: Self-pay | Admitting: Physician Assistant

## 2020-07-27 DIAGNOSIS — E789 Disorder of lipoprotein metabolism, unspecified: Secondary | ICD-10-CM

## 2020-07-27 DIAGNOSIS — E785 Hyperlipidemia, unspecified: Secondary | ICD-10-CM

## 2020-07-29 DIAGNOSIS — Z23 Encounter for immunization: Secondary | ICD-10-CM | POA: Diagnosis not present

## 2020-08-07 ENCOUNTER — Other Ambulatory Visit: Payer: Self-pay | Admitting: Physician Assistant

## 2020-08-07 DIAGNOSIS — Z1211 Encounter for screening for malignant neoplasm of colon: Secondary | ICD-10-CM

## 2020-08-07 NOTE — Progress Notes (Signed)
Placed per standing order

## 2020-08-19 ENCOUNTER — Other Ambulatory Visit: Payer: Self-pay | Admitting: Physician Assistant

## 2020-08-19 DIAGNOSIS — F429 Obsessive-compulsive disorder, unspecified: Secondary | ICD-10-CM

## 2020-09-16 ENCOUNTER — Other Ambulatory Visit: Payer: Self-pay | Admitting: Physician Assistant

## 2020-09-16 DIAGNOSIS — F429 Obsessive-compulsive disorder, unspecified: Secondary | ICD-10-CM

## 2020-09-17 ENCOUNTER — Ambulatory Visit: Payer: Federal, State, Local not specified - PPO | Admitting: Physician Assistant

## 2020-10-05 ENCOUNTER — Encounter: Payer: Self-pay | Admitting: Gastroenterology

## 2020-10-14 ENCOUNTER — Other Ambulatory Visit: Payer: Self-pay | Admitting: Physician Assistant

## 2020-10-14 ENCOUNTER — Ambulatory Visit: Payer: Federal, State, Local not specified - PPO | Admitting: Physician Assistant

## 2020-10-14 DIAGNOSIS — F429 Obsessive-compulsive disorder, unspecified: Secondary | ICD-10-CM

## 2020-10-15 ENCOUNTER — Telehealth: Payer: Self-pay | Admitting: Physician Assistant

## 2020-10-15 NOTE — Telephone Encounter (Signed)
Patient spoke with Chesley Noon on Tuesday and requested Kaileb to bring his work history report for the last 2 months to Kyle.  I placed this in the in basket on Martiza's CMA side. You can call Marco at [phone] to discuss.

## 2020-10-16 NOTE — Telephone Encounter (Signed)
Patient requesting a letter stating that he has missed work due to carpal tunnel. Patient has FMLA filled out by our office that states he can miss 2-3 times a MONTH for carpal tunnel issues. Patient has been missing more than that amount.   Missed dates are as stated below per patients work record: 08/14/20 08/17/20 08/20/20 08/25/20 08/26/20 08/28/20 08/31/20 09/08/20 09/09/20 09/15/20 09/16/20 09/18/20 10/02/20 10/05/20 10/06/20 10/09/20  Patients job is requiring him to provide a letter by provider stating we excuse these absences. Per Herb Grays patient has exceeded the allowed FMLA time and we are unable to provide a note.   Left msg for patient to call back. AS, CMA

## 2020-10-19 NOTE — Telephone Encounter (Signed)
Spoke with patient and advised we were unable to provide a note supporting all the absences due to patient not following FMLA guidelines and not calling our office to inform that he was missing more work due to this issue. Pt verbalized understanding. AS, CMA

## 2020-10-25 ENCOUNTER — Other Ambulatory Visit: Payer: Self-pay | Admitting: Physician Assistant

## 2020-10-25 DIAGNOSIS — E785 Hyperlipidemia, unspecified: Secondary | ICD-10-CM

## 2020-10-25 DIAGNOSIS — E789 Disorder of lipoprotein metabolism, unspecified: Secondary | ICD-10-CM

## 2020-10-26 ENCOUNTER — Other Ambulatory Visit: Payer: Self-pay | Admitting: Physician Assistant

## 2020-10-26 DIAGNOSIS — I10 Essential (primary) hypertension: Secondary | ICD-10-CM

## 2020-10-27 ENCOUNTER — Ambulatory Visit: Payer: Federal, State, Local not specified - PPO | Admitting: Physician Assistant

## 2020-11-03 ENCOUNTER — Encounter: Payer: Self-pay | Admitting: Physician Assistant

## 2020-11-03 ENCOUNTER — Ambulatory Visit (INDEPENDENT_AMBULATORY_CARE_PROVIDER_SITE_OTHER): Payer: Federal, State, Local not specified - PPO | Admitting: Physician Assistant

## 2020-11-03 ENCOUNTER — Other Ambulatory Visit: Payer: Self-pay

## 2020-11-03 VITALS — BP 137/88 | HR 66 | Temp 98.8°F | Ht 67.0 in | Wt 170.8 lb

## 2020-11-03 DIAGNOSIS — F429 Obsessive-compulsive disorder, unspecified: Secondary | ICD-10-CM

## 2020-11-03 DIAGNOSIS — I10 Essential (primary) hypertension: Secondary | ICD-10-CM | POA: Diagnosis not present

## 2020-11-03 NOTE — Assessment & Plan Note (Signed)
-  Stable. -Continue current medication regimen.  -Will continue to monitor. 

## 2020-11-03 NOTE — Progress Notes (Signed)
Established Patient Office Visit  Subjective:  Patient ID: Paul Carson, adult    DOB: [birth year]  Age: 55 y.o. MRN: [id number]  CC:  Chief Complaint  Patient presents with   Follow-up    Medication Management     HPI DAQUAN RHOTON presents for follow up on mood management. Paxil was discontinued and patient was started on Zoloft due to side effects of abnormal dreams. Patient reports Zoloft caused more anxiety and fixations so self-discontinued medication and re-started Paxil 30 mg. States has only experienced one night of wild dreams. Patient continues to monitor BP at home and states readings are usually <130/80. Denies chest pain, palpitations, shortness of breath or edema. Reports good hydration.  Past Medical History:  Diagnosis Date   Allergy    seasonal   Anxiety    Heart murmur    Hyperlipidemia    Hypertension    OCD (obsessive compulsive disorder)    OSA (obstructive sleep apnea) 06/06/2017    Past Surgical History:  Procedure Laterality Date   carpal tunel  2012   left wrist    CARPAL TUNNEL RELEASE  09/2010   left wrist   VASECTOMY      Family History  Problem Relation Age of Onset   Hypertension Father    Healthy Mother    Healthy Brother    Healthy Daughter    Healthy Son    Colon cancer Neg Hx    Colon polyps Neg Hx    Rectal cancer Neg Hx    Stomach cancer Neg Hx     Social History   Socioeconomic History   Marital status: Married    Spouse name: Ivin Booty   Number of children: 2   Years of education: college   Highest education level: Bachelor's degree (e.g., BA, AB, BS)  Occupational History   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   Smoke detectors in home? Yes    Guns in home? No    Consistent seatbelt use? Yes    Consistent dental brushing? Yes Flossing? No   Semi-Annual dental visits: Yes    Annual Eye exams: Yes    Social Determinants of Health   Financial Resource Strain: Not on file  Food Insecurity: Not on file  Transportation Needs: Not on file  Physical Activity: Not on file  Stress: Not on file  Social Connections: Not on file  Intimate Partner Violence: Not on file    Outpatient Medications Prior to Visit  Medication Sig Dispense Refill   atorvastatin (LIPITOR) 20 MG tablet TAKE 1 TABLET BY MOUTH EVERY DAY 90 tablet 0   indomethacin (INDOCIN) 50 MG capsule Take 1 capsule (50 mg total) by mouth 3 (three) times daily with meals. 15 capsule 0   lisinopril (ZESTRIL) 10 MG tablet TAKE 1 TABLET BY MOUTH EVERY DAY 90 tablet 0   Melatonin 5  MG TABS Take by mouth at bedtime as needed.     PARoxetine (PAXIL) 30 MG tablet      PARoxetine (PAXIL) 10 MG tablet Take 1 tablet by mouth daily x 7 days. Then take 0.5 tablet by mouth daily x 7 days. 11 tablet 0   sertraline (ZOLOFT) 25 MG tablet TAKE 1 TABLET (25 MG TOTAL) BY MOUTH DAILY. 30 tablet 0   No facility-administered medications prior to visit.    No Known Allergies  ROS Review of Systems Review of Systems:  A fourteen system review of systems was performed and found to be positive as per HPI.   Objective:    Physical Exam General:  Well Developed, well nourished, in no acute distress  Neuro:  Alert and oriented,  extra-ocular muscles intact  HEENT:  Normocephalic, atraumatic, neck supple Skin:  no gross rash, warm, pink. Cardiac:  RRR Respiratory:  ECTA B/L w/o wheezing Not using accessory muscles, speaking in full sentences- unlabored. Vascular:   Ext warm, no cyanosis apprec.; cap RF less 2 sec. Psych:  No HI/SI, judgement and insight good, Euthymic mood. Full Affect.  BP 137/88   Pulse 66   Temp 98.8 F (37.1 C)   Ht '5\' 7"'$  (1.702 m)   Wt 170 lb 12.8 oz (77.5 kg)   SpO2 97%   BMI 26.75 kg/m  Wt Readings from Last 3 Encounters:  11/03/20 170 lb 12.8 oz (77.5 kg)  07/22/20 181 lb 3.2 oz (82.2 kg)  03/23/20 173 lb 14.4 oz (78.9 kg)     Health Maintenance Due  Topic Date Due   COVID-19 Vaccine (1) Never done   Hepatitis C Screening  Never done   COLONOSCOPY (Pts 45-28yr Insurance coverage will need to be confirmed)  04/21/2020    There are no preventive care reminders to display for this patient.  Lab Results  Component Value Date   TSH 1.730 03/23/2020   Lab Results  Component Value Date   WBC 4.9 03/23/2020   HGB 16.3 03/23/2020   HCT 47.0 03/23/2020   MCV 87 03/23/2020   PLT 329 03/23/2020   Lab Results  Component Value Date   NA 141 03/23/2020   K 4.5 03/23/2020   CO2 23 03/23/2020   GLUCOSE 95 03/23/2020   BUN 12 03/23/2020   CREATININE 1.05 03/23/2020   BILITOT 0.5 03/23/2020   ALKPHOS 61 03/23/2020   AST 25 03/23/2020   ALT 30 03/23/2020   PROT 7.3 03/23/2020   ALBUMIN 4.2 03/23/2020   CALCIUM 9.3 03/23/2020   Lab Results  Component Value Date   CHOL 191 03/23/2020   Lab Results  Component Value Date   HDL 50 03/23/2020   Lab Results  Component Value Date   LDLCALC 121 (H) 03/23/2020   Lab Results  Component Value Date   TRIG 109 03/23/2020   Lab Results  Component Value Date   CHOLHDL 3.8 03/23/2020   Lab Results  Component Value Date   HGBA1C 5.7 (H) 03/23/2020      Assessment & Plan:   Problem List Items Addressed This Visit       Cardiovascular and Mediastinum   HTN (hypertension)    -Stable. -Continue current medication regimen. -Will continue to monitor.         Other   Obsessive-compulsive disorder - Primary    -PHQ-9 score of 0, GAD-7 score of  0. -Patient prefers to continue with paroxetine 30 mg before considering alternative medication. Recommend to monitor for significant  side effects. -Will continue to monitor.        Relevant Medications   PARoxetine (PAXIL) 30 MG tablet     No orders of the defined types were placed in this encounter.   Follow-up: Return in about 5 months (around 04/05/2021) for CPE and FBW few days prior .   Note:  This note was prepared with assistance of Dragon voice recognition software. Occasional wrong-word or sound-a-like substitutions may have occurred due to the inherent limitations of voice recognition software.  Lorrene Reid, PA-C

## 2020-11-03 NOTE — Assessment & Plan Note (Signed)
-  PHQ-9 score of 0, GAD-7 score of 0. -Patient prefers to continue with paroxetine 30 mg before considering alternative medication. Recommend to monitor for significant side effects. -Will continue to monitor.

## 2020-11-03 NOTE — Patient Instructions (Signed)

## 2020-11-05 DIAGNOSIS — R059 Cough, unspecified: Secondary | ICD-10-CM | POA: Diagnosis not present

## 2020-11-05 DIAGNOSIS — Z20822 Contact with and (suspected) exposure to covid-19: Secondary | ICD-10-CM | POA: Diagnosis not present

## 2020-11-05 DIAGNOSIS — J069 Acute upper respiratory infection, unspecified: Secondary | ICD-10-CM | POA: Diagnosis not present

## 2020-12-24 DIAGNOSIS — M19032 Primary osteoarthritis, left wrist: Secondary | ICD-10-CM | POA: Diagnosis not present

## 2020-12-24 DIAGNOSIS — M1811 Unilateral primary osteoarthritis of first carpometacarpal joint, right hand: Secondary | ICD-10-CM | POA: Diagnosis not present

## 2020-12-24 DIAGNOSIS — M1812 Unilateral primary osteoarthritis of first carpometacarpal joint, left hand: Secondary | ICD-10-CM | POA: Diagnosis not present

## 2020-12-25 ENCOUNTER — Other Ambulatory Visit: Payer: Self-pay

## 2020-12-25 ENCOUNTER — Ambulatory Visit (AMBULATORY_SURGERY_CENTER): Payer: Federal, State, Local not specified - PPO | Admitting: *Deleted

## 2020-12-25 VITALS — Ht 67.0 in | Wt 170.0 lb

## 2020-12-25 DIAGNOSIS — Z86010 Personal history of colonic polyps: Secondary | ICD-10-CM

## 2020-12-25 MED ORDER — PLENVU 140 G PO SOLR: 1.0000 | Freq: Once | ORAL | 0 refills | Status: AC

## 2020-12-25 NOTE — Progress Notes (Signed)
Patient's pre-visit was done today over the phone with the patient due to COVID-19 pandemic. Name,DOB and address verified. Patient denies any allergies to Eggs and Soy. Patient denies any problems with anesthesia/sedation. Patient is not taking any diet pills or blood thinners. No home Oxygen. Packet of Prep instructions mailed to patient including a copy of a consent form-pt is aware. Patient understands to call us back with any questions or concerns. Patient is aware of our care-partner policy and 0000000 safety protocol.   EMMI education assigned to the patient for the procedure, sent to [place].   The patient is COVID-19 vaccinated.  Plenvu coupon mailed to pt.

## 2021-01-08 ENCOUNTER — Encounter: Payer: Self-pay | Admitting: Gastroenterology

## 2021-01-08 ENCOUNTER — Ambulatory Visit (AMBULATORY_SURGERY_CENTER): Payer: Federal, State, Local not specified - PPO | Admitting: Gastroenterology

## 2021-01-08 ENCOUNTER — Other Ambulatory Visit: Payer: Self-pay

## 2021-01-08 VITALS — BP 125/86 | HR 60 | Temp 97.8°F | Resp 8 | Ht 67.0 in | Wt 170.0 lb

## 2021-01-08 DIAGNOSIS — D128 Benign neoplasm of rectum: Secondary | ICD-10-CM

## 2021-01-08 DIAGNOSIS — Z86010 Personal history of colonic polyps: Secondary | ICD-10-CM | POA: Diagnosis not present

## 2021-01-08 DIAGNOSIS — Z1211 Encounter for screening for malignant neoplasm of colon: Secondary | ICD-10-CM | POA: Diagnosis not present

## 2021-01-08 DIAGNOSIS — D123 Benign neoplasm of transverse colon: Secondary | ICD-10-CM | POA: Diagnosis not present

## 2021-01-08 MED ORDER — SODIUM CHLORIDE 0.9 % IV SOLN: 500.0000 mL | Freq: Once | INTRAVENOUS | Status: DC

## 2021-01-08 NOTE — Progress Notes (Signed)
Called to room to assist during endoscopic procedure.  Patient ID and intended procedure confirmed with present staff. Received instructions for my participation in the procedure from the performing physician.  

## 2021-01-08 NOTE — Patient Instructions (Signed)
Impression/Recommendations:  Polyp and hemorrhoid handouts given to patient.  Resume previous diet. Continue present medications. Await pathology results.  Repeat colonoscopy in 5 years for surveillance.  Date to be determined after pathology results reviewed.  YOU HAD AN ENDOSCOPIC PROCEDURE TODAY AT [facility]:   Refer to the procedure report that was given to you for any specific questions about what was found during the examination.  If the procedure report does not answer your questions, please call your gastroenterologist to clarify.  If you requested that your care partner not be given the details of your procedure findings, then the procedure report has been included in a sealed envelope for you to review at your convenience later.  YOU SHOULD EXPECT: Some feelings of bloating in the abdomen. Passage of more gas than usual.  Walking can help get rid of the air that was put into your GI tract during the procedure and reduce the bloating. If you had a lower endoscopy (such as a colonoscopy or flexible sigmoidoscopy) you may notice spotting of blood in your stool or on the toilet paper. If you underwent a bowel prep for your procedure, you may not have a normal bowel movement for a few days.  Please Note:  You might notice some irritation and congestion in your nose or some drainage.  This is from the oxygen used during your procedure.  There is no need for concern and it should clear up in a day or so.  SYMPTOMS TO REPORT IMMEDIATELY:  Following lower endoscopy (colonoscopy or flexible sigmoidoscopy):  Excessive amounts of blood in the stool  Significant tenderness or worsening of abdominal pains  Swelling of the abdomen that is new, acute  Fever of 100F or higher  For urgent or emergent issues, a gastroenterologist can be reached at any hour by calling [phone]. Do not use MyChart messaging for urgent concerns.    DIET:  We do recommend a small meal at  first, but then you may proceed to your regular diet.  Drink plenty of fluids but you should avoid alcoholic beverages for 24 hours.  ACTIVITY:  You should plan to take it easy for the rest of today and you should NOT DRIVE or use heavy machinery until tomorrow (because of the sedation medicines used during the test).    FOLLOW UP: Our staff will call the number listed on your records 48-72 hours following your procedure to check on you and address any questions or concerns that you may have regarding the information given to you following your procedure. If we do not reach you, we will leave a message.  We will attempt to reach you two times.  During this call, we will ask if you have developed any symptoms of COVID 19. If you develop any symptoms (ie: fever, flu-like symptoms, shortness of breath, cough etc.) before then, please call [phone].  If you test positive for Covid 19 in the 2 weeks post procedure, please call and report this information to [place].    If any biopsies were taken you will be contacted by phone or by letter within the next 1-3 weeks.  Please call us at [phone] if you have not heard about the biopsies in 3 weeks.    SIGNATURES/CONFIDENTIALITY: You and/or your care partner have signed paperwork which will be entered into your electronic medical record.  These signatures attest to the fact that that the information above on your After Visit Summary has been reviewed and is  understood.  Full responsibility of the confidentiality of this discharge information lies with you and/or your care-partner.

## 2021-01-08 NOTE — Op Note (Signed)
[place] Patient Name: Paul Carson Procedure Date: 01/08/2021 2:57 PM MRN: [id number] Endoscopist: Mauri Pole , MD Age: 55 Referring MD:  Date of Birth: 12/10/1965 Gender: Male Account #: [id number] Procedure:                Colonoscopy Indications:              High risk colon cancer surveillance: Personal                            history of colonic polyps, High risk colon cancer                            surveillance: Personal history of adenoma (10 mm or                            greater in size), High risk colon cancer                            surveillance: Personal history of multiple (3 or                            more) adenomas Medicines:                Monitored Anesthesia Care Procedure:                Pre-Anesthesia Assessment:                           - Prior to the procedure, a History and Physical                            was performed, and patient medications and                            allergies were reviewed. The patient's tolerance of                            previous anesthesia was also reviewed. The risks                            and benefits of the procedure and the sedation                            options and risks were discussed with the patient.                            All questions were answered, and informed consent                            was obtained. ASA Grade Assessment: II - A patient                            with mild systemic disease. After reviewing the  risks and benefits, the patient was deemed in                            satisfactory condition to undergo the procedure.                           After obtaining informed consent, the colonoscope                            was passed under direct vision. Throughout the                            procedure, the patient's blood pressure, pulse, and                            oxygen saturations were monitored continuously.  The                            Olympus PCF-H190DL (#[id number]) Colonoscope was                            introduced through the anus and advanced to the the                            cecum, identified by appendiceal orifice and                            ileocecal valve. The colonoscopy was performed                            without difficulty. The patient tolerated the                            procedure well. The quality of the bowel                            preparation was good. The ileocecal valve,                            appendiceal orifice, and rectum were photographed. Scope In: 3:10:23 PM Scope Out: 3:25:23 PM Scope Withdrawal Time: 0 hours 11 minutes 19 seconds  Total Procedure Duration: 0 hours 15 minutes 0 seconds  Findings:                 The perianal and digital rectal examinations were                            normal.                           Three sessile polyps were found in the rectum and                            transverse colon. The polyps were 3 to 5 mm in  size. These polyps were removed with a cold snare.                            Resection and retrieval were complete.                           Non-bleeding external and internal hemorrhoids were                            found during retroflexion. The hemorrhoids were                            medium-sized. Complications:            No immediate complications. Estimated Blood Loss:     Estimated blood loss was minimal. Impression:               - Three 3 to 5 mm polyps in the rectum and in the                            transverse colon, removed with a cold snare.                            Resected and retrieved.                           - Non-bleeding external and internal hemorrhoids. Recommendation:           - Patient has a contact number available for                            emergencies. The signs and symptoms of potential                            delayed  complications were discussed with the                            patient. Return to normal activities tomorrow.                            Written discharge instructions were provided to the                            patient.                           - Resume previous diet.                           - Continue present medications.                           - Await pathology results.                           - Repeat colonoscopy in 5 years for surveillance  based on pathology results. Mauri Pole, MD 01/08/2021 3:30:18 PM This report has been signed electronically.

## 2021-01-08 NOTE — Progress Notes (Signed)
[facility] History and Physical   Primary Care Physician:  Lorrene Reid, PA-C   Reason for Procedure:  History of adenomatous colon polyps  Plan:    Surveillance colonoscopy with possible interventions as needed     HPI: Paul Carson is a very pleasant 55 y.o. adult here for screening colonoscopy. Denies any nausea, vomiting, abdominal pain, melena or bright red blood per rectum  The risks and benefits as well as alternatives of endoscopic procedure(s) have been discussed and reviewed. All questions answered. The patient agrees to proceed.    Past Medical History:  Diagnosis Date   Allergy    seasonal   Anxiety    Heart murmur    Hyperlipidemia    Hypertension    OCD (obsessive compulsive disorder)    OSA (obstructive sleep apnea) 06/06/2017   No CPAP    Past Surgical History:  Procedure Laterality Date   carpal tunel  04/11/2010   left wrist    CARPAL TUNNEL RELEASE  09/10/2010   left wrist   COLONOSCOPY  04/21/2017   Dr.Enedina Pair   POLYPECTOMY     VASECTOMY      Prior to Admission medications   Medication Sig Start Date End Date Taking? Authorizing Provider  atorvastatin (LIPITOR) 20 MG tablet TAKE 1 TABLET BY MOUTH EVERY DAY 10/26/20  Yes Abonza, Maritza, PA-C  lisinopril (ZESTRIL) 10 MG tablet TAKE 1 TABLET BY MOUTH EVERY DAY 10/26/20  Yes Abonza, Maritza, PA-C  Melatonin 5 MG TABS Take by mouth at bedtime as needed.   Yes [provider]  PARoxetine (PAXIL) 30 MG tablet  10/21/20  Yes [provider]    Current Outpatient Medications  Medication Sig Dispense Refill   atorvastatin (LIPITOR) 20 MG tablet TAKE 1 TABLET BY MOUTH EVERY DAY 90 tablet 0   lisinopril (ZESTRIL) 10 MG tablet TAKE 1 TABLET BY MOUTH EVERY DAY 90 tablet 0   Melatonin 5 MG TABS Take by mouth at bedtime as needed.     PARoxetine (PAXIL) 30 MG tablet      Current Facility-Administered Medications  Medication Dose Route Frequency Provider Last Rate  Last Admin   0.9 %  sodium chloride infusion  500 mL Intravenous Once Mauri Pole, MD        Allergies as of 01/08/2021   (No Known Allergies)    Family History  Problem Relation Age of Onset   Healthy Mother    Hypertension Father    Healthy Brother    Healthy Daughter    Healthy Son    Colon cancer Neg Hx    Colon polyps Neg Hx    Rectal cancer Neg Hx    Stomach cancer Neg Hx    Esophageal cancer Neg Hx     Social History   Socioeconomic History   Marital status: Married    Spouse name: Ivin Booty   Number of children: 2   Years of education: college   Highest education level: Bachelor's degree (e.g., BA, AB, BS)  Occupational History   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   Smoke detectors in home? Yes    Guns in home? No    Consistent seatbelt use? Yes    Consistent dental brushing? Yes Flossing? No   Semi-Annual dental visits: Yes    Annual Eye exams: Yes    Social Determinants of Health   Financial Resource Strain: Not on file  Food Insecurity: Not on file  Transportation Needs: Not on file  Physical Activity: Not on file  Stress: Not on file  Social Connections: Not on file  Intimate Partner Violence: Not on file    Review of Systems:  All other review of systems negative except as mentioned in the HPI.  Physical Exam: Vital signs in last 24 hours: BP (!) 117/96   Pulse 94   Temp 97.8 F (36.6 C) (Temporal)   Ht 5\' 7"  (1.702 m)   Wt 170 lb (77.1 kg)    SpO2 95%   BMI 26.63 kg/m     General:   Alert, NAD Lungs:  Clear .   Heart:  Regular rate and rhythm Abdomen:  Soft, nontender and nondistended. Neuro/Psych:  Alert and cooperative. Normal mood and affect. A and O x 3  Reviewed labs, radiology imaging, old records and pertinent past GI work up  Patient is appropriate for planned procedure(s) and anesthesia in an ambulatory setting   K. Denzil Magnuson , MD [phone]

## 2021-01-08 NOTE — Progress Notes (Signed)
PT taken to PACU. Monitors in place. VSS. Report given to RN. 

## 2021-01-08 NOTE — Progress Notes (Signed)
Pt's states no medical or surgical changes since previsit or office visit.  Vitals by DT 

## 2021-01-11 ENCOUNTER — Other Ambulatory Visit: Payer: Self-pay

## 2021-01-11 DIAGNOSIS — F429 Obsessive-compulsive disorder, unspecified: Secondary | ICD-10-CM

## 2021-01-11 MED ORDER — PAROXETINE HCL 30 MG PO TABS: 30.0000 mg | ORAL_TABLET | Freq: Every day | ORAL | 0 refills | Status: DC

## 2021-01-12 ENCOUNTER — Telehealth: Payer: Self-pay | Admitting: *Deleted

## 2021-01-12 NOTE — Telephone Encounter (Signed)
Attempted to call patient for their post-procedure follow-up call. Wrong phone number given, however, the number called was the number for this patient's cell, home, and work number listed. Will attempt to call again this afternoon.

## 2021-01-12 NOTE — Telephone Encounter (Signed)
  Follow up Call-  Call back number 01/08/2021  Post procedure Call Back phone  # [phone]  Permission to leave phone message Yes  Some recent data might be hidden     Patient questions:  Do you have a fever, pain , or abdominal swelling? No. Pain Score  0 *  Have you tolerated food without any problems? Yes.    Have you been able to return to your normal activities? Yes.    Do you have any questions about your discharge instructions: Diet   No. Medications  No. Follow up visit  No.  Do you have questions or concerns about your Care? Yes.    Actions: * If pain score is 4 or above: No action needed, pain <4.  Have you developed a fever since your procedure? no  2.   Have you had an respiratory symptoms (SOB or cough) since your procedure? no  3.   Have you tested positive for COVID 19 since your procedure no  4.   Have you had any family members/close contacts diagnosed with the COVID 19 since your procedure?  no   If yes to any of these questions please route to Joylene John, RN and Joella Prince, RN

## 2021-01-19 ENCOUNTER — Encounter: Payer: Self-pay | Admitting: Gastroenterology

## 2021-01-19 ENCOUNTER — Other Ambulatory Visit: Payer: Self-pay | Admitting: Physician Assistant

## 2021-01-19 DIAGNOSIS — E789 Disorder of lipoprotein metabolism, unspecified: Secondary | ICD-10-CM

## 2021-01-19 DIAGNOSIS — E785 Hyperlipidemia, unspecified: Secondary | ICD-10-CM

## 2021-01-19 DIAGNOSIS — I10 Essential (primary) hypertension: Secondary | ICD-10-CM

## 2021-01-21 ENCOUNTER — Telehealth: Payer: Self-pay

## 2021-01-21 NOTE — Telephone Encounter (Signed)
Communication with the patient through My Chart.

## 2021-01-21 NOTE — Telephone Encounter (Signed)
The patient is interested in having a banding procedure. His colonoscopy was in September. Not on anticoagulation therapy. Is it okay for me to schedule him?

## 2021-01-21 NOTE — Telephone Encounter (Signed)
Ok to schedule next available appt Thanks 

## 2021-02-09 ENCOUNTER — Encounter: Payer: Self-pay | Admitting: Physician Assistant

## 2021-02-19 ENCOUNTER — Encounter: Payer: Self-pay | Admitting: Gastroenterology

## 2021-02-19 ENCOUNTER — Ambulatory Visit: Payer: Federal, State, Local not specified - PPO | Admitting: Gastroenterology

## 2021-02-19 VITALS — BP 130/84 | HR 60 | Ht 67.0 in | Wt 177.0 lb

## 2021-02-19 DIAGNOSIS — K641 Second degree hemorrhoids: Secondary | ICD-10-CM

## 2021-02-19 MED ORDER — MICONAZOLE NITRATE 2 % EX CREA: TOPICAL_CREAM | CUTANEOUS | 0 refills | Status: DC

## 2021-02-19 NOTE — Patient Instructions (Addendum)
We have sent the following medications to your pharmacy for you to pick up at your convenience: Miconazole 2% twice daily x 4 weeks.  You have been scheduled for hemorrhoidal banding #2 on 03/15/21 at 3:30 pm.  HEMORRHOID BANDING PROCEDURE    FOLLOW-UP CARE   The procedure you have had should have been relatively painless since the banding of the area involved does not have nerve endings and there is no pain sensation.  The rubber band cuts off the blood supply to the hemorrhoid and the band may fall off as soon as 48 hours after the banding (the band may occasionally be seen in the toilet bowl following a bowel movement). You may notice a temporary feeling of fullness in the rectum which should respond adequately to plain Tylenol or Motrin.  Following the banding, avoid strenuous exercise that evening and resume full activity the next day.  A sitz bath (soaking in a warm tub) or bidet is soothing, and can be useful for cleansing the area after bowel movements.     To avoid constipation, take two tablespoons of natural wheat bran, natural oat bran, flax, Benefiber or any over the counter fiber supplement and increase your water intake to 7-8 glasses daily.    Unless you have been prescribed anorectal medication, do not put anything inside your rectum for two weeks: No suppositories, enemas, fingers, etc.  Occasionally, you may have more bleeding than usual after the banding procedure.  This is often from the untreated hemorrhoids rather than the treated one.  Don't be concerned if there is a tablespoon or so of blood.  If there is more blood than this, lie flat with your bottom higher than your head and apply an ice pack to the area. If the bleeding does not stop within a half an hour or if you feel faint, call our office at [phone] or go to the emergency room.  Problems are not common; however, if there is a substantial amount of bleeding, severe pain, chills, fever or difficulty  passing urine (very rare) or other problems, you should call us at [phone] or report to the nearest emergency room.  Do not stay seated continuously for more than 2-3 hours for a day or two after the procedure.  Tighten your buttock muscles 10-15 times every two hours and take 10-15 deep breaths every 1-2 hours.  Do not spend more than a few minutes on the toilet if you cannot empty your bowel; instead re-visit the toilet at a later time.

## 2021-02-19 NOTE — Progress Notes (Signed)
  PROCEDURE NOTE: The patient presents with symptomatic grade II  hemorrhoids, requesting rubber band ligation of his/her hemorrhoidal disease.  All risks, benefits and alternative forms of therapy were described and informed consent was obtained.  In the Left Lateral Decubitus position anoscopic examination revealed grade II hemorrhoids in the Right posterior and right anterior position(s).  The anorectum was pre-medicated with 0.125% NTG and Recticare The decision was made to band the Right posterior internal hemorrhoid, and the [place] was used to perform band ligation without complication.  Digital anorectal examination was then performed to assure proper positioning of the band, and to adjust the banded tissue as required.  The patient was discharged home without pain or other issues.  Dietary and behavioral recommendations were given and along with follow-up instructions.     The following adjunctive treatments were recommended:  Miconazole 2% peri anal area small pea size amount twice daily for 4 weeks  The patient will return in 2-4 weeks for  follow-up and possible additional banding as required. No complications were encountered and the patient tolerated the procedure well.  Damaris Hippo , MD [phone]

## 2021-03-11 DIAGNOSIS — M19032 Primary osteoarthritis, left wrist: Secondary | ICD-10-CM | POA: Diagnosis not present

## 2021-03-11 DIAGNOSIS — M1812 Unilateral primary osteoarthritis of first carpometacarpal joint, left hand: Secondary | ICD-10-CM | POA: Diagnosis not present

## 2021-03-15 ENCOUNTER — Ambulatory Visit: Payer: Federal, State, Local not specified - PPO | Admitting: Gastroenterology

## 2021-03-15 ENCOUNTER — Encounter: Payer: Federal, State, Local not specified - PPO | Admitting: Gastroenterology

## 2021-03-21 ENCOUNTER — Other Ambulatory Visit: Payer: Self-pay | Admitting: Physician Assistant

## 2021-03-21 DIAGNOSIS — F429 Obsessive-compulsive disorder, unspecified: Secondary | ICD-10-CM

## 2021-03-29 ENCOUNTER — Other Ambulatory Visit: Payer: Self-pay

## 2021-03-29 DIAGNOSIS — I10 Essential (primary) hypertension: Secondary | ICD-10-CM

## 2021-03-29 DIAGNOSIS — Z125 Encounter for screening for malignant neoplasm of prostate: Secondary | ICD-10-CM

## 2021-03-29 DIAGNOSIS — E785 Hyperlipidemia, unspecified: Secondary | ICD-10-CM

## 2021-03-29 DIAGNOSIS — Z0000 Encounter for general adult medical examination without abnormal findings: Secondary | ICD-10-CM

## 2021-03-29 DIAGNOSIS — Z1329 Encounter for screening for other suspected endocrine disorder: Secondary | ICD-10-CM

## 2021-03-31 ENCOUNTER — Other Ambulatory Visit: Payer: Federal, State, Local not specified - PPO

## 2021-03-31 ENCOUNTER — Other Ambulatory Visit: Payer: Self-pay

## 2021-03-31 DIAGNOSIS — I10 Essential (primary) hypertension: Secondary | ICD-10-CM | POA: Diagnosis not present

## 2021-03-31 DIAGNOSIS — Z0000 Encounter for general adult medical examination without abnormal findings: Secondary | ICD-10-CM | POA: Diagnosis not present

## 2021-03-31 DIAGNOSIS — Z125 Encounter for screening for malignant neoplasm of prostate: Secondary | ICD-10-CM | POA: Diagnosis not present

## 2021-03-31 DIAGNOSIS — E785 Hyperlipidemia, unspecified: Secondary | ICD-10-CM | POA: Diagnosis not present

## 2021-03-31 DIAGNOSIS — Z1321 Encounter for screening for nutritional disorder: Secondary | ICD-10-CM | POA: Diagnosis not present

## 2021-03-31 DIAGNOSIS — Z1329 Encounter for screening for other suspected endocrine disorder: Secondary | ICD-10-CM | POA: Diagnosis not present

## 2021-03-31 DIAGNOSIS — Z13228 Encounter for screening for other metabolic disorders: Secondary | ICD-10-CM

## 2021-04-01 LAB — CBC WITH DIFFERENTIAL/PLATELET
Basophils Absolute: 0 10*3/uL (ref 0.0–0.2)
Basos: 1 %
EOS (ABSOLUTE): 0.1 10*3/uL (ref 0.0–0.4)
Eos: 2 %
Hematocrit: 49.3 % (ref 37.5–51.0)
Hemoglobin: 17.1 g/dL (ref 13.0–17.7)
Immature Grans (Abs): 0 10*3/uL (ref 0.0–0.1)
Immature Granulocytes: 1 %
Lymphocytes Absolute: 1.3 10*3/uL (ref 0.7–3.1)
Lymphs: 20 %
MCH: 29.8 pg (ref 26.6–33.0)
MCHC: 34.7 g/dL (ref 31.5–35.7)
MCV: 86 fL (ref 79–97)
Monocytes Absolute: 0.7 10*3/uL (ref 0.1–0.9)
Monocytes: 10 %
Neutrophils Absolute: 4.4 10*3/uL (ref 1.4–7.0)
Neutrophils: 66 %
Platelets: 357 10*3/uL (ref 150–450)
RBC: 5.74 x10E6/uL (ref 4.14–5.80)
RDW: 12.8 % (ref 11.6–15.4)
WBC: 6.6 10*3/uL (ref 3.4–10.8)

## 2021-04-01 LAB — LIPID PANEL
Chol/HDL Ratio: 3.5 ratio (ref 0.0–5.0)
Cholesterol, Total: 213 mg/dL — ABNORMAL HIGH (ref 100–199)
HDL: 61 mg/dL (ref >39)
LDL Chol Calc (NIH): 134 mg/dL — ABNORMAL HIGH (ref 0–99)
Triglycerides: 102 mg/dL (ref 0–149)
VLDL Cholesterol Cal: 18 mg/dL (ref 5–40)

## 2021-04-01 LAB — COMPREHENSIVE METABOLIC PANEL
ALT: 34 IU/L (ref 0–44)
AST: 27 IU/L (ref 0–40)
Albumin/Globulin Ratio: 1.9 (ref 1.2–2.2)
Albumin: 4.4 g/dL (ref 3.8–4.9)
Alkaline Phosphatase: 51 IU/L (ref 44–121)
BUN/Creatinine Ratio: 14 (ref 9–20)
BUN: 15 mg/dL (ref 6–24)
Bilirubin Total: 1.3 mg/dL — ABNORMAL HIGH (ref 0.0–1.2)
CO2: 24 mmol/L (ref 20–29)
Calcium: 9.2 mg/dL (ref 8.7–10.2)
Chloride: 102 mmol/L (ref 96–106)
Creatinine, Ser: 1.07 mg/dL (ref 0.76–1.27)
Globulin, Total: 2.3 g/dL (ref 1.5–4.5)
Glucose: 95 mg/dL (ref 70–99)
Potassium: 4.3 mmol/L (ref 3.5–5.2)
Sodium: 139 mmol/L (ref 134–144)
Total Protein: 6.7 g/dL (ref 6.0–8.5)
eGFR: 82 mL/min/{1.73_m2} (ref >59)

## 2021-04-01 LAB — HEMOGLOBIN A1C
Est. average glucose Bld gHb Est-mCnc: 120 mg/dL
Hgb A1c MFr Bld: 5.8 % — ABNORMAL HIGH (ref 4.8–5.6)

## 2021-04-01 LAB — TSH: TSH: 1.48 u[IU]/mL (ref 0.450–4.500)

## 2021-04-01 LAB — PSA: Prostate Specific Ag, Serum: 0.4 ng/mL (ref 0.0–4.0)

## 2021-04-07 ENCOUNTER — Encounter: Payer: Federal, State, Local not specified - PPO | Admitting: Physician Assistant

## 2021-04-13 ENCOUNTER — Encounter: Payer: Self-pay | Admitting: Physician Assistant

## 2021-04-19 ENCOUNTER — Other Ambulatory Visit: Payer: Self-pay | Admitting: Physician Assistant

## 2021-04-19 DIAGNOSIS — E789 Disorder of lipoprotein metabolism, unspecified: Secondary | ICD-10-CM

## 2021-04-19 DIAGNOSIS — E785 Hyperlipidemia, unspecified: Secondary | ICD-10-CM

## 2021-04-19 DIAGNOSIS — I10 Essential (primary) hypertension: Secondary | ICD-10-CM

## 2021-04-20 ENCOUNTER — Other Ambulatory Visit: Payer: Self-pay

## 2021-04-20 ENCOUNTER — Encounter: Payer: Self-pay | Admitting: Physician Assistant

## 2021-04-20 ENCOUNTER — Ambulatory Visit (INDEPENDENT_AMBULATORY_CARE_PROVIDER_SITE_OTHER): Payer: Federal, State, Local not specified - PPO | Admitting: Physician Assistant

## 2021-04-20 VITALS — BP 118/84 | HR 70 | Temp 97.7°F | Ht 67.0 in | Wt 179.0 lb

## 2021-04-20 DIAGNOSIS — I10 Essential (primary) hypertension: Secondary | ICD-10-CM | POA: Diagnosis not present

## 2021-04-20 DIAGNOSIS — M109 Gout, unspecified: Secondary | ICD-10-CM | POA: Diagnosis not present

## 2021-04-20 DIAGNOSIS — Z0000 Encounter for general adult medical examination without abnormal findings: Secondary | ICD-10-CM

## 2021-04-20 DIAGNOSIS — G4700 Insomnia, unspecified: Secondary | ICD-10-CM | POA: Diagnosis not present

## 2021-04-20 DIAGNOSIS — F429 Obsessive-compulsive disorder, unspecified: Secondary | ICD-10-CM | POA: Diagnosis not present

## 2021-04-20 DIAGNOSIS — Z23 Encounter for immunization: Secondary | ICD-10-CM

## 2021-04-20 MED ORDER — PAROXETINE HCL 30 MG PO TABS: 30.0000 mg | ORAL_TABLET | Freq: Every day | ORAL | 1 refills | Status: DC

## 2021-04-20 MED ORDER — TRAZODONE HCL 50 MG PO TABS: 25.0000 mg | ORAL_TABLET | Freq: Every evening | ORAL | 1 refills | Status: DC | PRN

## 2021-04-20 MED ORDER — METHYLPREDNISOLONE 4 MG PO TBPK: ORAL_TABLET | ORAL | 0 refills | Status: DC

## 2021-04-20 NOTE — Progress Notes (Signed)
Male physical   Impression and Recommendations:    1. Healthcare maintenance   2. Obsessive-compulsive disorder, unspecified type   3. Primary hypertension   4. Insomnia, unspecified type   5. Acute gout of left foot, unspecified cause   6. Need for influenza vaccination      1) Anticipatory Guidance: Skin CA prevention- recommend to use sunscreen when outside along with skin surveillance; eating a balanced and modest diet; physical activity at least 25 minutes per day or minimum of 150 min/ week moderate to intense activity.  2) Immunizations / Screenings / Labs:   All immunizations are up-to-date per recommendations or will be updated today if pt allows.    - Patient understands with dental and vision screens they will schedule independently.  - Obtained CBC, CMP, HgA1c, Lipid panel, TSH. Most labs are essentially within normal limits or stable from prior with the exception of mild increase in A1c and LDL. Discussed heart healthy diet lo in fat and carbohydrates. - Patient agreeable to influenza vaccine.  - UTD colonoscopy. Deferred Hep C screening.  3) Weight:  Recommend to continue to improve diet habits to improve overall feelings of well being and objective health data. Improve nutrient density of diet through increasing intake of fruits and vegetables and decreasing saturated fats, white flour products and refined sugars.  -Encourage to continue with reduction of sodas and red meat.  4) Healthcare Maintenance: -Discussed with patient reducing/avoiding high purine foods, will start corticosteroid therapy for acute exacerbation. Do not recommend prophylactic medication at this time, this is the first exacerbation since last one 2 years ago. Will reconsider if has more acute attacks.  -Will trial Trazodone for insomnia. Discussed good sleep hygiene.   -BP initially elevated, BP repeated and improved. -Continue current medication regimen. -Recommend to stay well  hydrated. -  Orders Placed This Encounter  Procedures   Flu Vaccine QUAD 6+ mos PF IM (Fluarix Quad PF)    Meds ordered this encounter  Medications   PARoxetine (PAXIL) 30 MG tablet    Sig: Take 1 tablet (30 mg total) by mouth daily.    Dispense:  90 tablet    Refill:  1   traZODone (DESYREL) 50 MG tablet    Sig: Take 0.5-1 tablets (25-50 mg total) by mouth at bedtime as needed for sleep.    Dispense:  30 tablet    Refill:  1    Order Specific Question:   Supervising Provider    Answer:   Beatrice Lecher D [[id number]]   methylPREDNISolone (MEDROL DOSEPAK) 4 MG TBPK tablet    Sig: Take as directed on package.    Dispense:  21 tablet    Refill:  0    Order Specific Question:   Supervising Provider    Answer:   Beatrice Lecher D [[id number]]     Return in about 4 months (around 08/18/2021) for Mood, insomnia, HTN and FBW (lipid panel, cmp, a1c).  Reminded pt important of f-up preventative CPE in 1 year.  Reminded pt again, this is in addition to any chronic care visits.    Gross side effects, risk and benefits, and alternatives of medications discussed with patient.  Patient is aware that all medications have potential side effects and we are unable to predict every side effect or drug-drug interaction that may occur.  Expresses verbal understanding and consents to current therapy plan and treatment regimen.  Please see AVS handed out to patient at the end of our visit  for further patient instructions/ counseling done pertaining to today's office visit.       Subjective:        CC: CPE   HPI: Paul Carson is a 56 y.o. adult who presents to [place] at [facility] today for a yearly health maintenance exam.     Health Maintenance Summary  - Reviewed and updated, unless pt declines services.  Last Cologuard or Colonoscopy:   01/08/2021- repeat in 5 yrs  Tobacco History Reviewed:   yes, former smoker with 5 pack yr hx Abdominal Ultrasound:   n/a   CT scan for screening lung CA:   n/a Alcohol / drug use:    No concerns, no excessive use / no use Dental Home: y  Eye exams: y Male history: STD concerns:   none Additional penile/ urinary concerns: none   Additional concerns beyond Health Maintenance issues:   Problem with falling asleep which has been a chronic issue. In the past has tried Ambien which he did not tolerate due to side effects. Also reports continues to have left foot pain and redness, states has been eating poorly including more red meat than usual.     Immunization History  Administered Date(s) Administered   Influenza,inj,Quad PF,6+ Mos 03/25/2014, 04/13/2015, 03/29/2016, 01/03/2017, 03/14/2018, 03/23/2020, 04/20/2021   Tdap 04/11/2008, 03/14/2018   Zoster Recombinat (Shingrix) 03/23/2020, 07/29/2020     Health Maintenance  Topic Date Due   COVID-19 Vaccine (1) Never done   Hepatitis C Screening  Never done   COLONOSCOPY (Pts 45-3yrs Insurance coverage will need to be confirmed)  01/08/2026   TETANUS/TDAP  03/14/2028   INFLUENZA VACCINE  Completed   HIV Screening  Completed   Zoster Vaccines- Shingrix  Completed   Pneumococcal Vaccine 69-52 Years old  Aged Out   HPV VACCINES  Aged Out   MAMMOGRAM  Discontinued   PAP SMEAR-Modifier  Discontinued       Wt Readings from Last 3 Encounters:  04/20/21 179 lb (81.2 kg)  02/19/21 177 lb (80.3 kg)  01/08/21 170 lb (77.1 kg)   BP Readings from Last 3 Encounters:  04/20/21 118/84  02/19/21 130/84  01/08/21 125/86   Pulse Readings from Last 3 Encounters:  04/20/21 70  02/19/21 60  01/08/21 60    Patient Active Problem List   Diagnosis Date Noted   Arthritis of carpometacarpal ([place]) joint of left thumb 02/13/2020   Neck mass 09/13/2018   Hyperlipidemia 03/14/2018   Acute pain of right shoulder 03/14/2018   Healthcare maintenance 01/17/2018   Insomnia 01/17/2018   OSA (obstructive sleep apnea) 06/06/2017   Adenomatous colon polyp 04/29/2017    Obsessive-compulsive disorder 01/07/2017   Gout 03/30/2012   HTN (hypertension) 03/27/2012   Lipid disorder 03/27/2012    Past Medical History:  Diagnosis Date   Allergy    seasonal   Anxiety    Heart murmur    Hyperlipidemia    Hypertension    OCD (obsessive compulsive disorder)    OSA (obstructive sleep apnea) 06/06/2017   No CPAP    Past Surgical History:  Procedure Laterality Date   CARPAL TUNNEL RELEASE  09/10/2010   left wrist   COLONOSCOPY  04/21/2017   Dr.Nandigam   POLYPECTOMY     VASECTOMY      Family History  Problem Relation Age of Onset   Healthy Mother    Hypertension Father    Healthy Brother    Healthy Daughter    Healthy Son  Colon cancer Neg Hx    Colon polyps Neg Hx    Rectal cancer Neg Hx    Stomach cancer Neg Hx    Esophageal cancer Neg Hx     Social History   Substance and Sexual Activity  Drug Use No  ,  Social History   Substance and Sexual Activity  Alcohol Use No   Alcohol/week: 0.0 standard drinks  ,  Social History   Tobacco Use  Smoking Status Former   Packs/day: 0.50   Years: 10.00   Pack years: 5.00   Types: Cigarettes   Quit date: 06/29/1998   Years since quitting: 22.8  Smokeless Tobacco Never  ,  Social History   Substance and Sexual Activity  Sexual Activity Yes   Birth control/protection: Surgical    Patient's Medications  New Prescriptions   METHYLPREDNISOLONE (MEDROL DOSEPAK) 4 MG TBPK TABLET    Take as directed on package.   TRAZODONE (DESYREL) 50 MG TABLET    Take 0.5-1 tablets (25-50 mg total) by mouth at bedtime as needed for sleep.  Previous Medications   ATORVASTATIN (LIPITOR) 20 MG TABLET    TAKE 1 TABLET BY MOUTH EVERY DAY   LISINOPRIL (ZESTRIL) 10 MG TABLET    TAKE 1 TABLET BY MOUTH EVERY DAY   MELATONIN 5 MG TABS    Take by mouth at bedtime as needed.  Modified Medications   Modified Medication Previous Medication   PAROXETINE (PAXIL) 30 MG TABLET PARoxetine (PAXIL) 30 MG tablet       Take 1 tablet (30 mg total) by mouth daily.    Take 1 tablet (30 mg total) by mouth daily.  Discontinued Medications   MICONAZOLE (MICATIN) 2 % CREAM    Apply small, pea sized amount to affected area twice daily x 4 weeks.    Patient has no known allergies.  Review of Systems: General:   Denies fever, chills, unexplained weight loss.  Optho/Auditory:   Denies visual changes, blurred vision/LOV Respiratory:   Denies SOB, DOE more than baseline levels.   Cardiovascular:   Denies chest pain, palpitations, new onset peripheral edema  Gastrointestinal:   Denies nausea, vomiting, diarrhea.  Genitourinary: Denies dysuria, freq/ urgency, flank pain  Endocrine:     Denies hot or cold intolerance, polyuria, polydipsia. Musculoskeletal:   +left foot pain and redness, no gait issues  Skin:  Denies rash, suspicious lesions Neurological:     Denies dizziness, unexplained weakness, numbness  Psychiatric/Behavioral:   Denies mood changes, suicidal or homicidal ideations, hallucinations    Objective:     Blood pressure 118/84, pulse 70, temperature 97.7 F (36.5 C), height 5\' 7"  (1.702 m), weight 179 lb (81.2 kg), SpO2 98 %. Body mass index is 28.04 kg/m. General Appearance:    Alert, cooperative, no distress, appears stated age  Head:    Normocephalic, without obvious abnormality, atraumatic  Eyes:    PERRL, conjunctiva/corneas clear, EOM's intact, fundi    benign, both eyes  Ears:    Normal TM's and external ear canals, both ears  Nose:   Nares normal, septum midline, mucosa normal, no drainage    or sinus tenderness  Throat:   Lips w/o lesion, mucosa moist, and tongue normal; teeth and gums normal  Neck:   Supple, symmetrical, trachea midline, no adenopathy;    thyroid:  no enlargement/tenderness/nodules; no JVD  Back:     Symmetric, no curvature, ROM normal, no CVA tenderness  Lungs:     Clear to auscultation bilaterally, respirations unlabored,  no Wh/ R/ R  Chest Wall:    No tenderness  or gross deformity; normal excursion   Heart:    Regular rate and rhythm, S1 and S2 normal  Abdomen:     Soft, non-tender, bowel sounds active all four quadrants, No G/R/R, no masses, no organomegaly  Genitalia:   Deferred.  Rectal:   Deferred.  Extremities:   Extremities normal, atraumatic, no cyanosis or gross edema  Pulses:   2+ and symmetric all extremities  Skin:   Warm, dry, Skin color, texture, turgor normal, no obvious rashes or lesions, small skin abrasion of right forearm noted  M-Sk:   Tenderness and redness of left foot dorsum with mild swelling, full ROM  Neurologic:   CNII-XII grossly intact Psych:  No HI/SI, judgement and insight good, Euthymic mood. Full Affect.

## 2021-04-20 NOTE — Patient Instructions (Addendum)

## 2021-05-19 IMAGING — US SOFT TISSUE ULTRASOUND HEAD/NECK
1 series · 12 of 12 positions shown · non-contrast
Comparison: None.

CLINICAL DATA: 53-year-old male with fullness/mass in the right
supraclavicular fossa

EXAM:
ULTRASOUND OF HEAD/NECK SOFT TISSUES
TECHNIQUE: Ultrasound examination of the head and neck soft tissues was
performed in the area of clinical concern.

[Series 1: soft tissue ultrasound head/neck · 0.07mm/px · 12 of 12 slices shown]
[im 1/12]
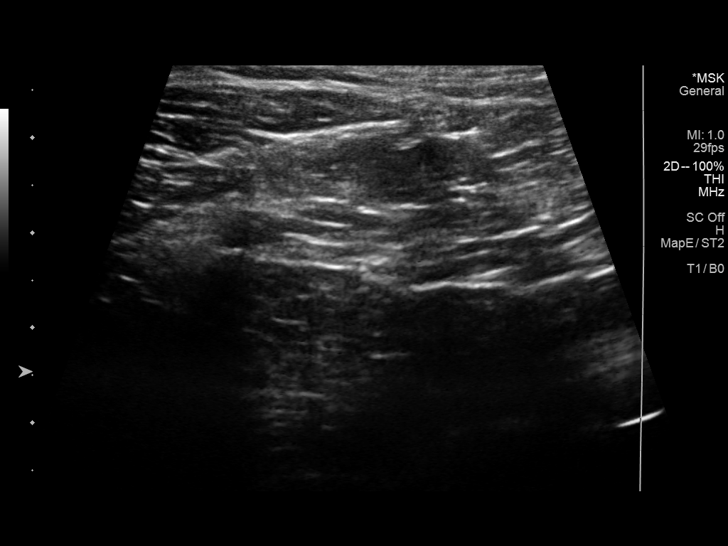
[im 2/12]
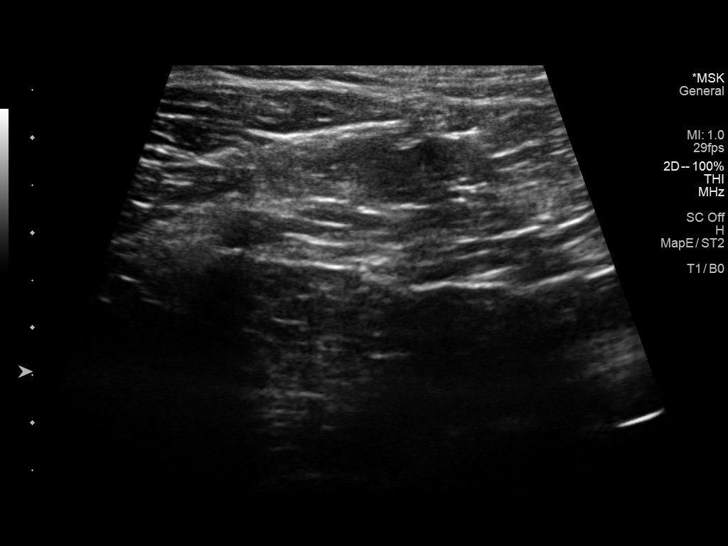
[im 3/12]
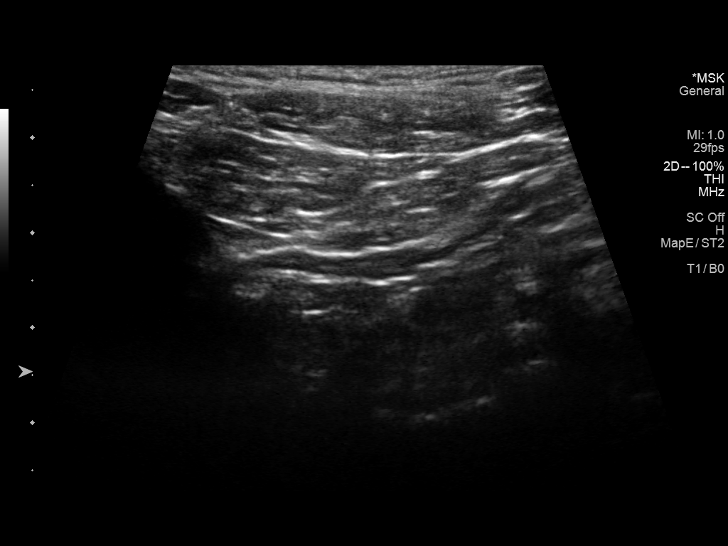
[im 4/12]
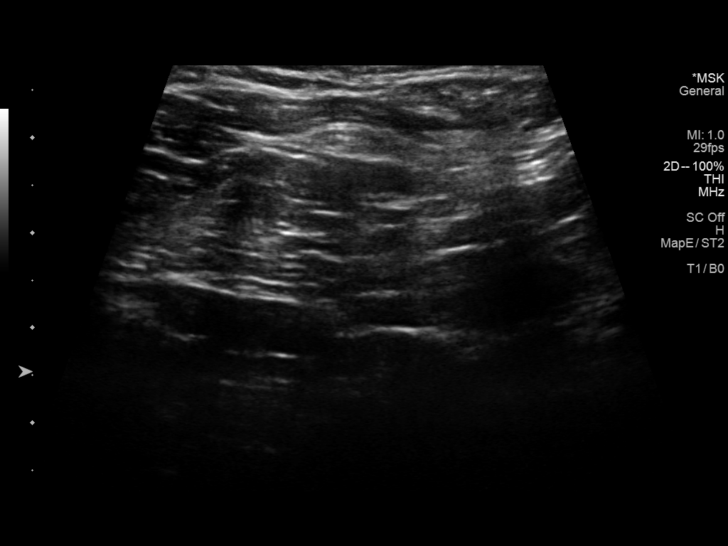
[im 5/12]
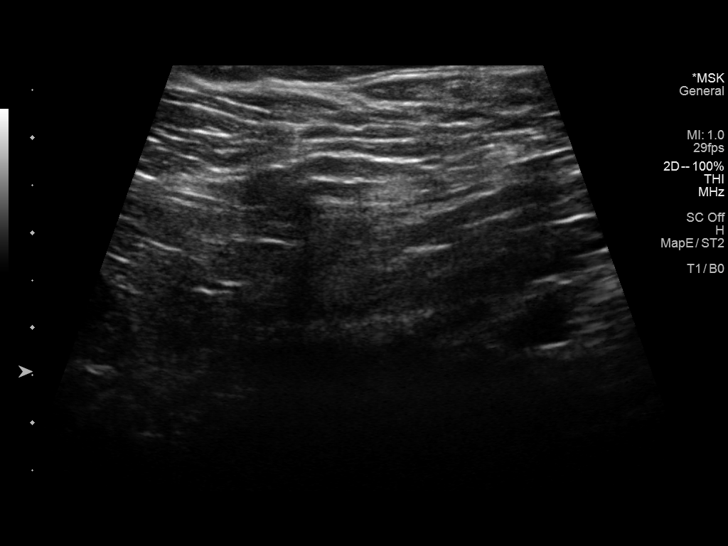
[im 6/12]
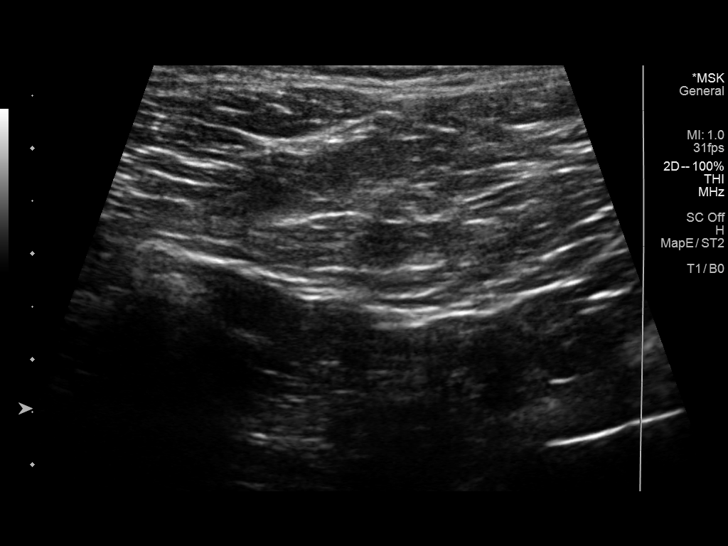
[im 7/12]
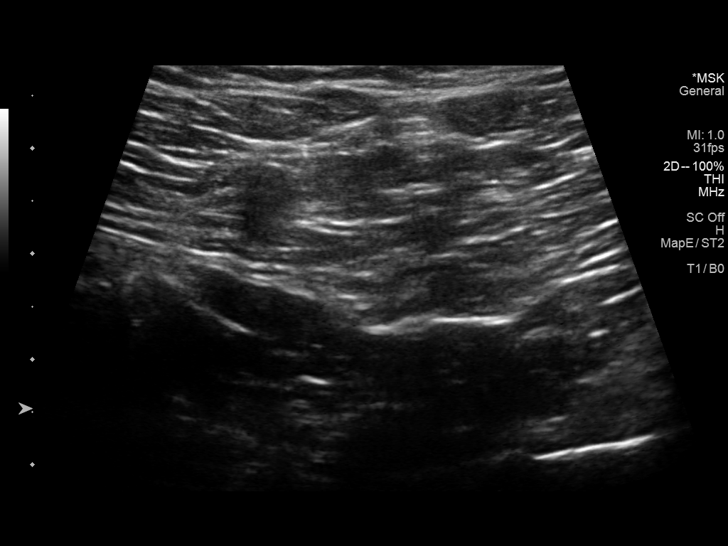
[im 8/12]
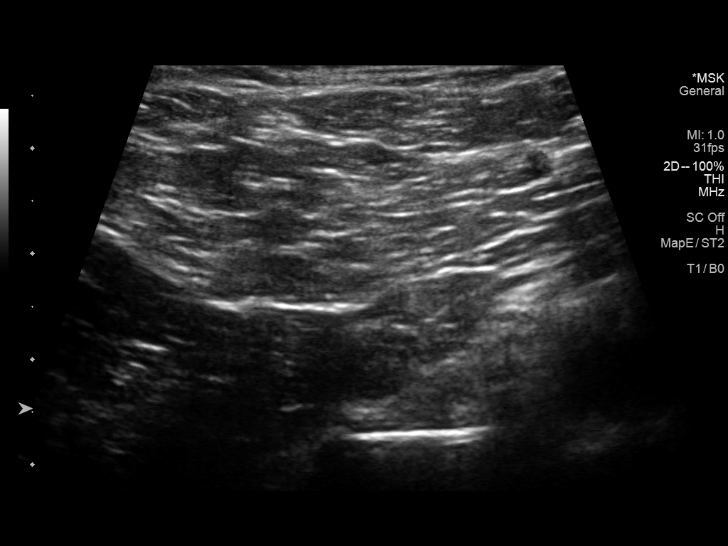
[im 9/12]
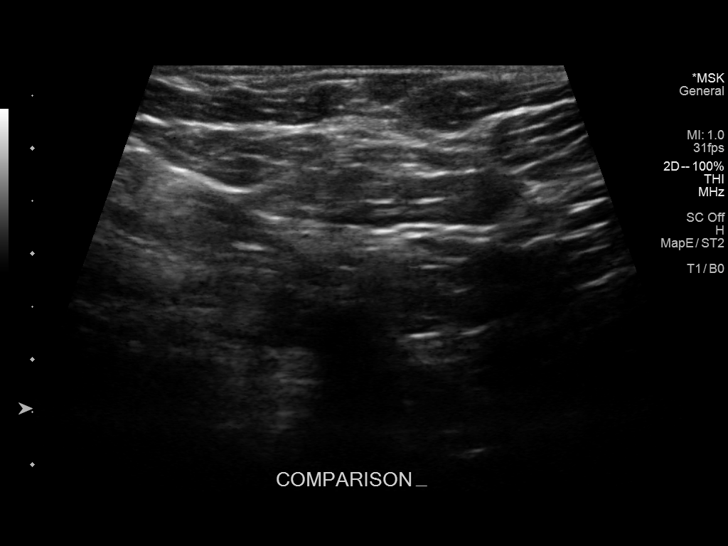
[im 10/12]
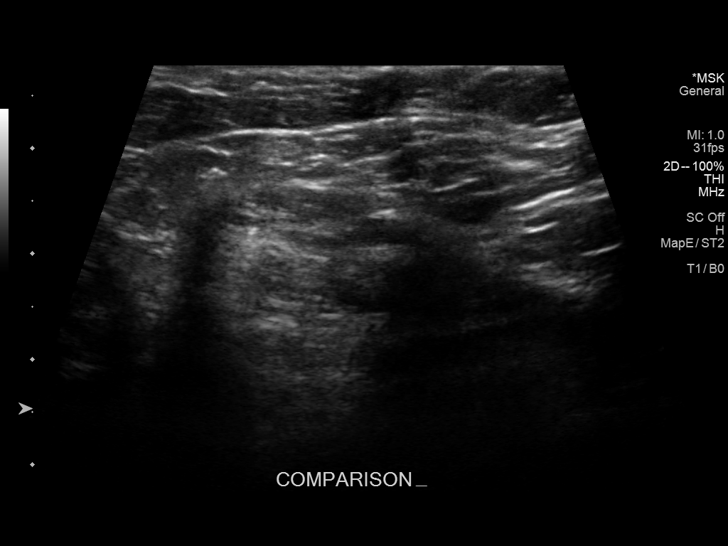
[im 11/12]
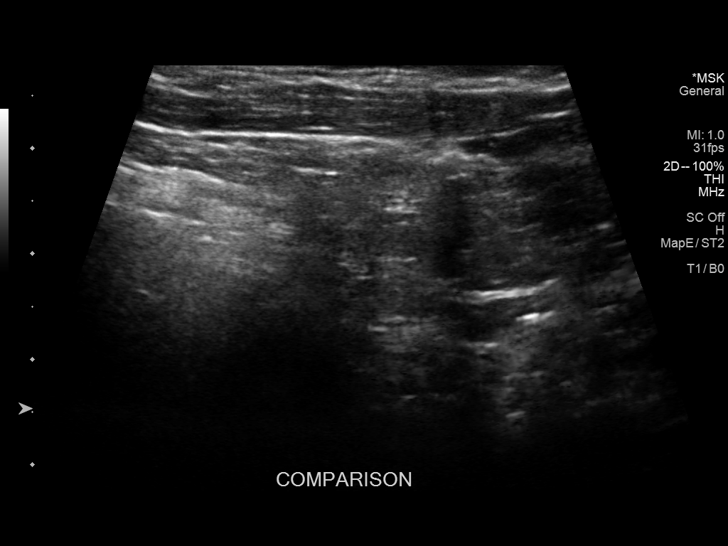
[im 12/12]
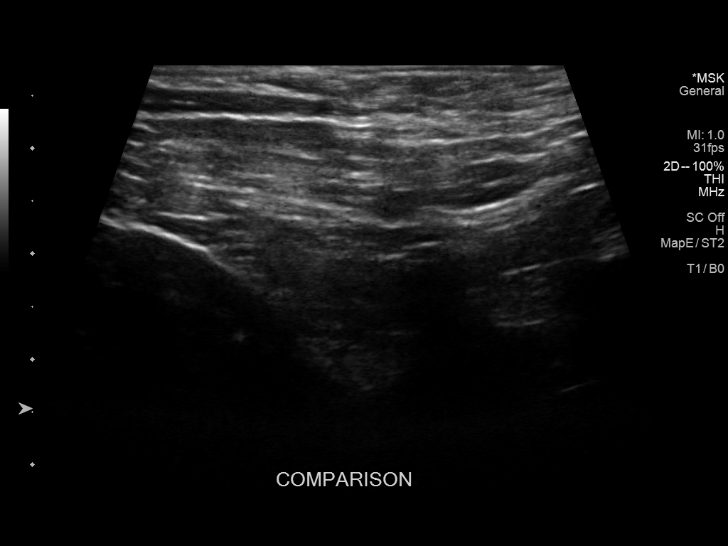

[12 of 12 positions shown; findings below may reference images not displayed]

FINDINGS: Sonographic evaluation of the right supraclavicular fossa was
performed with additional evaluation of the left supraclavicular
fossa for comparison.

No evidence of solid or cystic mass lesion. No circumscribed fatty
lesion to suggest the presence of lipoma.
IMPRESSION: Negative sonographic evaluation of the right supraclavicular fossa.
No mass or lesion identified.

## 2021-06-14 ENCOUNTER — Other Ambulatory Visit: Payer: Self-pay | Admitting: Physician Assistant

## 2021-06-14 DIAGNOSIS — G4700 Insomnia, unspecified: Secondary | ICD-10-CM

## 2021-06-23 DIAGNOSIS — S63501A Unspecified sprain of right wrist, initial encounter: Secondary | ICD-10-CM | POA: Insufficient documentation

## 2021-06-23 DIAGNOSIS — M659 Synovitis and tenosynovitis, unspecified: Secondary | ICD-10-CM | POA: Insufficient documentation

## 2021-06-23 DIAGNOSIS — M65931 Unspecified synovitis and tenosynovitis, right forearm: Secondary | ICD-10-CM | POA: Insufficient documentation

## 2021-06-23 DIAGNOSIS — M25531 Pain in right wrist: Secondary | ICD-10-CM | POA: Diagnosis not present

## 2021-08-24 ENCOUNTER — Ambulatory Visit (INDEPENDENT_AMBULATORY_CARE_PROVIDER_SITE_OTHER): Payer: Federal, State, Local not specified - PPO | Admitting: Physician Assistant

## 2021-08-24 ENCOUNTER — Encounter: Payer: Self-pay | Admitting: Physician Assistant

## 2021-08-24 VITALS — BP 102/66 | HR 67 | Temp 97.7°F | Ht 67.0 in | Wt 170.0 lb

## 2021-08-24 DIAGNOSIS — R7301 Impaired fasting glucose: Secondary | ICD-10-CM

## 2021-08-24 DIAGNOSIS — I10 Essential (primary) hypertension: Secondary | ICD-10-CM

## 2021-08-24 DIAGNOSIS — G4700 Insomnia, unspecified: Secondary | ICD-10-CM | POA: Diagnosis not present

## 2021-08-24 DIAGNOSIS — F429 Obsessive-compulsive disorder, unspecified: Secondary | ICD-10-CM | POA: Diagnosis not present

## 2021-08-24 NOTE — Patient Instructions (Signed)

## 2021-08-24 NOTE — Assessment & Plan Note (Signed)
-  Last A1c 5.8, will repeat today. Recommend to continue with diet changes including low carbohydrate/glucose intake. Will continue to monitor. ?

## 2021-08-24 NOTE — Progress Notes (Signed)
?Established patient visit ? ? ?Patient: Paul Carson   DOB: [birth year]   56 y.o. Adult  MRN: [id number] ?Visit Date: 08/24/2021 ? ?Chief Complaint  ?Patient presents with  ? Hypertension  ? Insomnia  ? ?Subjective  ?  ?HPI  ?Patient presents for follow up on hypertension, mood and insomnia. ? ?HTN: Pt denies chest pain, palpitations, dizziness, shortness of breath or lower extremity swelling. Taking medication as directed without side effects. Checks BP at home and readings range in 130-140/80. Pt reports recent increased stress levels related to wife's health. ? ?Insomnia: Patient was started on Trazodone. Reports medication has helped with sleep. Taking Trazodone 50 mg when needed.  ? ?Mood: Reports medication compliance. States medication helped get through recent stress. No labile mood or uncontrolled OCD.  ? ?Prediabetes: No increased thirst or urination. Patient reports has cut back on pepsi.  ? ? ?  08/24/2021  ?  9:55 AM 04/20/2021  ? 10:11 AM 11/03/2020  ?  1:20 PM 07/22/2020  ?  8:53 AM 03/23/2020  ?  8:59 AM  ?Depression screen PHQ 2/9  ?Decreased Interest 0 0 0 0 0  ?Down, Depressed, Hopeless 0 0 0 0 0  ?PHQ - 2 Score 0 0 0 0 0  ?Altered sleeping 0 1 0 0 0  ?Tired, decreased energy 0 1 0 0 0  ?Change in appetite 0 0 0 0 0  ?Feeling bad or failure about yourself  0 0 0 0 0  ?Trouble concentrating 0 0 0 0 0  ?Moving slowly or fidgety/restless 0 0 0 0 0  ?Suicidal thoughts 0 0 0 0 0  ?PHQ-9 Score 0 2 0 0 0  ?Difficult doing work/chores Not difficult at all Not difficult at all     ? ? ?  08/24/2021  ?  9:56 AM 04/20/2021  ? 10:11 AM 11/03/2020  ?  1:20 PM  ?GAD 7 : Generalized Anxiety Score  ?Nervous, Anxious, on Edge 0 0 0  ?Control/stop worrying 0 0 0  ?Worry too much - different things 0 0 0  ?Trouble relaxing 0 0 0  ?Restless 0 0 0  ?Easily annoyed or irritable 0 0 0  ?Afraid - awful might happen 0 0 0  ?Total GAD 7 Score 0 0 0  ?Anxiety Difficulty Not difficult at all Not difficult at all   ? ? ?   ? ? ?Medications: ?Outpatient Medications Prior to Visit  ?Medication Sig  ? atorvastatin (LIPITOR) 20 MG tablet TAKE 1 TABLET BY MOUTH EVERY DAY  ? lisinopril (ZESTRIL) 10 MG tablet TAKE 1 TABLET BY MOUTH EVERY DAY  ? Melatonin 5 MG TABS Take by mouth at bedtime as needed.  ? PARoxetine (PAXIL) 30 MG tablet Take 1 tablet (30 mg total) by mouth daily.  ? traZODone (DESYREL) 50 MG tablet TAKE 0.5-1 TABLETS BY MOUTH AT BEDTIME AS NEEDED FOR SLEEP.  ? [DISCONTINUED] methylPREDNISolone (MEDROL DOSEPAK) 4 MG TBPK tablet Take as directed on package. (Patient not taking: Reported on 08/24/2021)  ? ?No facility-administered medications prior to visit.  ? ? ?Review of Systems ? ?Last CBC ?Lab Results  ?Component Value Date  ? WBC 6.6 03/31/2021  ? HGB 17.1 03/31/2021  ? HCT 49.3 03/31/2021  ? MCV 86 03/31/2021  ? MCH 29.8 03/31/2021  ? RDW 12.8 03/31/2021  ? PLT 357 03/31/2021  ? ?Last metabolic panel ?Lab Results  ?Component Value Date  ? GLUCOSE 95 03/31/2021  ? NA 139 03/31/2021  ? K 4.3  03/31/2021  ? CL 102 03/31/2021  ? CO2 24 03/31/2021  ? BUN 15 03/31/2021  ? CREATININE 1.07 03/31/2021  ? EGFR 82 03/31/2021  ? CALCIUM 9.2 03/31/2021  ? PROT 6.7 03/31/2021  ? ALBUMIN 4.4 03/31/2021  ? LABGLOB 2.3 03/31/2021  ? AGRATIO 1.9 03/31/2021  ? BILITOT 1.3 (H) 03/31/2021  ? ALKPHOS 51 03/31/2021  ? AST 27 03/31/2021  ? ALT 34 03/31/2021  ? ?Last lipids ?Lab Results  ?Component Value Date  ? CHOL 213 (H) 03/31/2021  ? HDL 61 03/31/2021  ? LDLCALC 134 (H) 03/31/2021  ? TRIG 102 03/31/2021  ? CHOLHDL 3.5 03/31/2021  ? ?Last hemoglobin A1c ?Lab Results  ?Component Value Date  ? HGBA1C 5.8 (H) 03/31/2021  ? ?Last thyroid functions ?Lab Results  ?Component Value Date  ? TSH 1.480 03/31/2021  ? T4TOTAL 7.6 09/24/2013  ? ?Last vitamin D ?Lab Results  ?Component Value Date  ? VD25OH 46 09/24/2013  ? ?  Objective  ?  ?BP 102/66   Pulse 67   Temp 97.7 ?F (36.5 ?C)   Ht _0  (1.702 m)   Wt 170 lb (77.1 kg)   SpO2 96%   BMI 26.63  kg/m?  ?BP Readings from Last 3 Encounters:  ?08/24/21 102/66  ?04/20/21 118/84  ?02/19/21 130/84  ? ?Wt Readings from Last 3 Encounters:  ?08/24/21 170 lb (77.1 kg)  ?04/20/21 179 lb (81.2 kg)  ?02/19/21 177 lb (80.3 kg)  ? ? ?Physical Exam  ?General:  Well Developed, well nourished, appropriate for stated age.  ?Neuro:  Alert and oriented,  extra-ocular muscles intact  ?HEENT:  Normocephalic, atraumatic, neck supple  ?Skin:  no gross rash, warm, pink. ?Cardiac:  RRR, S1 S2 ?Respiratory: CTA B/L  ?Vascular:  Ext warm, no cyanosis apprec.; no gross edema  ?Psych:  No HI/SI, judgement and insight good, Euthymic mood. Full Affect. ? ? ?No results found for any visits on 08/24/21. ? Assessment & Plan  ?  ? ? ?Problem List Items Addressed This Visit   ? ?  ? Cardiovascular and Mediastinum  ? HTN (hypertension)  ?  -BP at goal (<130/80). ?-Continue current medication regimen. ?-Will collect CMP for medication monitoring. ?-Will continue to monitor. ? ?  ?  ? Relevant Orders  ? Comp Met (CMET)  ?  ? Endocrine  ? Impaired fasting glucose  ?  -Last A1c 5.8, will repeat today. Recommend to continue with diet changes including low carbohydrate/glucose intake. Will continue to monitor. ? ?  ?  ? Relevant Orders  ? HgB A1c  ?  ? Other  ? Obsessive-compulsive disorder  ?  -Stable. ?-Continue current medication regimen. ?-Will continue to monitor. ? ?  ?  ? Insomnia - Primary  ?  -Improved. Continue Trazodone 50 mg as needed. Will continue to monitor. ? ?  ?  ? ? ?Return in about 4 months (around 12/25/2021) for HTN, HLD, mood, insomnia .  ?   ? ? ? ?Lorrene Reid, PA-C  ?[facility] ?[phone] (phone) ?[phone] (fax) ? ?[facility] ?

## 2021-08-24 NOTE — Assessment & Plan Note (Signed)
-  Stable. -Continue current medication regimen.  -Will continue to monitor. 

## 2021-08-24 NOTE — Assessment & Plan Note (Signed)
-  Improved. Continue Trazodone 50 mg as needed. Will continue to monitor. ?

## 2021-08-24 NOTE — Assessment & Plan Note (Signed)
-  BP at goal (<130/80). ?-Continue current medication regimen. ?-Will collect CMP for medication monitoring. ?-Will continue to monitor. ?

## 2021-08-25 ENCOUNTER — Ambulatory Visit: Payer: Federal, State, Local not specified - PPO | Admitting: Physician Assistant

## 2021-08-25 LAB — COMPREHENSIVE METABOLIC PANEL
ALT: 26 IU/L (ref 0–44)
AST: 21 IU/L (ref 0–40)
Albumin/Globulin Ratio: 1.9 (ref 1.2–2.2)
Albumin: 4.3 g/dL (ref 3.8–4.9)
Alkaline Phosphatase: 47 IU/L (ref 44–121)
BUN/Creatinine Ratio: 13 (ref 9–20)
BUN: 13 mg/dL (ref 6–24)
Bilirubin Total: 1 mg/dL (ref 0.0–1.2)
CO2: 18 mmol/L — ABNORMAL LOW (ref 20–29)
Calcium: 9.5 mg/dL (ref 8.7–10.2)
Chloride: 104 mmol/L (ref 96–106)
Creatinine, Ser: 0.97 mg/dL (ref 0.76–1.27)
Globulin, Total: 2.3 g/dL (ref 1.5–4.5)
Glucose: 90 mg/dL (ref 70–99)
Potassium: 4.1 mmol/L (ref 3.5–5.2)
Sodium: 142 mmol/L (ref 134–144)
Total Protein: 6.6 g/dL (ref 6.0–8.5)
eGFR: 92 mL/min/{1.73_m2} (ref >59)

## 2021-08-25 LAB — HEMOGLOBIN A1C
Est. average glucose Bld gHb Est-mCnc: 114 mg/dL
Hgb A1c MFr Bld: 5.6 % (ref 4.8–5.6)

## 2021-09-09 ENCOUNTER — Other Ambulatory Visit: Payer: Self-pay | Admitting: Physician Assistant

## 2021-09-09 DIAGNOSIS — G4700 Insomnia, unspecified: Secondary | ICD-10-CM

## 2021-09-25 ENCOUNTER — Other Ambulatory Visit: Payer: Self-pay | Admitting: Physician Assistant

## 2021-09-25 DIAGNOSIS — E789 Disorder of lipoprotein metabolism, unspecified: Secondary | ICD-10-CM

## 2021-09-25 DIAGNOSIS — F429 Obsessive-compulsive disorder, unspecified: Secondary | ICD-10-CM

## 2021-09-25 DIAGNOSIS — I10 Essential (primary) hypertension: Secondary | ICD-10-CM

## 2021-09-25 DIAGNOSIS — E785 Hyperlipidemia, unspecified: Secondary | ICD-10-CM

## 2021-10-06 ENCOUNTER — Other Ambulatory Visit: Payer: Self-pay | Admitting: Physician Assistant

## 2021-10-06 DIAGNOSIS — G4700 Insomnia, unspecified: Secondary | ICD-10-CM

## 2021-11-07 ENCOUNTER — Other Ambulatory Visit: Payer: Self-pay | Admitting: Physician Assistant

## 2021-11-07 DIAGNOSIS — G4700 Insomnia, unspecified: Secondary | ICD-10-CM

## 2021-12-07 ENCOUNTER — Other Ambulatory Visit: Payer: Self-pay | Admitting: Physician Assistant

## 2021-12-07 DIAGNOSIS — G4700 Insomnia, unspecified: Secondary | ICD-10-CM

## 2021-12-28 ENCOUNTER — Encounter: Payer: Self-pay | Admitting: Physician Assistant

## 2021-12-28 ENCOUNTER — Ambulatory Visit (INDEPENDENT_AMBULATORY_CARE_PROVIDER_SITE_OTHER): Payer: Federal, State, Local not specified - PPO | Admitting: Physician Assistant

## 2021-12-28 VITALS — BP 130/82 | HR 67 | Ht 68.0 in | Wt 172.0 lb

## 2021-12-28 DIAGNOSIS — I10 Essential (primary) hypertension: Secondary | ICD-10-CM | POA: Diagnosis not present

## 2021-12-28 DIAGNOSIS — F429 Obsessive-compulsive disorder, unspecified: Secondary | ICD-10-CM

## 2021-12-28 DIAGNOSIS — E785 Hyperlipidemia, unspecified: Secondary | ICD-10-CM | POA: Diagnosis not present

## 2021-12-28 DIAGNOSIS — G4700 Insomnia, unspecified: Secondary | ICD-10-CM | POA: Diagnosis not present

## 2021-12-28 MED ORDER — TRAZODONE HCL 50 MG PO TABS: 25.0000 mg | ORAL_TABLET | Freq: Every evening | ORAL | 1 refills | Status: DC | PRN

## 2021-12-28 NOTE — Assessment & Plan Note (Addendum)
-  Last lipid panel: HDL 61, LDL 134  -Recommend to continue atorvastatin 20 mg daily and diet changes by following a low fat diet. Recommend collecting fasting lipid panel and hepatic function at follow-up visit and adjust treatment plan as needed.

## 2021-12-28 NOTE — Patient Instructions (Signed)

## 2021-12-28 NOTE — Assessment & Plan Note (Signed)
-  BP elevated on intake, repeated with improved. Will continue current medication regimen, see med list. Recommend obtaining CMP for medication monitoring with fasting labs at follow-up visit.

## 2021-12-28 NOTE — Assessment & Plan Note (Signed)
-  Improved and stable. Continue Trazodone 50 mg and melatonin as needed. Good sleep hygiene discussed.

## 2021-12-28 NOTE — Progress Notes (Signed)
Established patient visit   Patient: Paul Carson   DOB: 1965/07/24   56 y.o. Male  MRN: [id number] Visit Date: 12/28/2021  Chief Complaint  Patient presents with   Follow-up    Patient presents for follow up. He states he feels that medication is working he is able to sleep. Mood is better.    Subjective    HPI HPI     Follow-up    Additional comments: Patient presents for follow up. He states he feels that medication is working he is able to sleep. Mood is better.       Last edited by Pennelope Bracken, CMA on 12/28/2021 10:47 AM.      Patient presents for chronic follow-up visit. No acute concerns.  HTN: Pt denies chest pain, palpitations, dizziness or leg swelling. Taking medication as directed without side effects. Has not checked blood pressure at home recently.   HLD: Pt taking medication as directed without issues. Reports has reduced fast food.  Mood: Reports medication compliance. Denies severe anxiety, panic attacks or issues with compulsiveness.   Insomnia: Reports takes Trazodone 50 mg and melatonin which has helped to fall asleep and insomnia is much better.       08/24/2021    9:55 AM 04/20/2021   10:11 AM 11/03/2020    1:20 PM 07/22/2020    8:53 AM 03/23/2020    8:59 AM  Depression screen PHQ 2/9  Decreased Interest 0 0 0 0 0  Down, Depressed, Hopeless 0 0 0 0 0  PHQ - 2 Score 0 0 0 0 0  Altered sleeping 0 1 0 0 0  Tired, decreased energy 0 1 0 0 0  Change in appetite 0 0 0 0 0  Feeling bad or failure about yourself  0 0 0 0 0  Trouble concentrating 0 0 0 0 0  Moving slowly or fidgety/restless 0 0 0 0 0  Suicidal thoughts 0 0 0 0 0  PHQ-9 Score 0 2 0 0 0  Difficult doing work/chores Not difficult at all Not difficult at all         08/24/2021    9:56 AM 04/20/2021   10:11 AM 11/03/2020    1:20 PM  GAD 7 : Generalized Anxiety Score  Nervous, Anxious, on Edge 0 0 0  Control/stop worrying 0 0 0  Worry too much - different things 0 0 0  Trouble  relaxing 0 0 0  Restless 0 0 0  Easily annoyed or irritable 0 0 0  Afraid - awful might happen 0 0 0  Total GAD 7 Score 0 0 0  Anxiety Difficulty Not difficult at all Not difficult at all         Medications: Outpatient Medications Prior to Visit  Medication Sig   atorvastatin (LIPITOR) 20 MG tablet TAKE 1 TABLET BY MOUTH EVERY DAY   lisinopril (ZESTRIL) 10 MG tablet TAKE 1 TABLET BY MOUTH EVERY DAY   Melatonin 5 MG TABS Take by mouth at bedtime as needed.   PARoxetine (PAXIL) 30 MG tablet TAKE 1 TABLET BY MOUTH EVERY DAY   [DISCONTINUED] traZODone (DESYREL) 50 MG tablet TAKE 1/2 TO 1 TABLET BY MOUTH AT BEDTIME AS NEEDED FOR SLEEP   No facility-administered medications prior to visit.    Review of Systems Review of Systems:  A fourteen system review of systems was performed and found to be positive as per HPI.   Last CBC Lab Results  Component Value Date  WBC 6.6 03/31/2021   HGB 17.1 03/31/2021   HCT 49.3 03/31/2021   MCV 86 03/31/2021   MCH 29.8 03/31/2021   RDW 12.8 03/31/2021   PLT 357 27/78/2423   Last metabolic panel Lab Results  Component Value Date   GLUCOSE 90 08/24/2021   NA 142 08/24/2021   K 4.1 08/24/2021   CL 104 08/24/2021   CO2 18 (L) 08/24/2021   BUN 13 08/24/2021   CREATININE 0.97 08/24/2021   EGFR 92 08/24/2021   CALCIUM 9.5 08/24/2021   PROT 6.6 08/24/2021   ALBUMIN 4.3 08/24/2021   LABGLOB 2.3 08/24/2021   AGRATIO 1.9 08/24/2021   BILITOT 1.0 08/24/2021   ALKPHOS 47 08/24/2021   AST 21 08/24/2021   ALT 26 08/24/2021   Last lipids Lab Results  Component Value Date   CHOL 213 (H) 03/31/2021   HDL 61 03/31/2021   LDLCALC 134 (H) 03/31/2021   TRIG 102 03/31/2021   CHOLHDL 3.5 03/31/2021   Last hemoglobin A1c Lab Results  Component Value Date   HGBA1C 5.6 08/24/2021   Last thyroid functions Lab Results  Component Value Date   TSH 1.480 03/31/2021   T4TOTAL 7.6 09/24/2013   Last vitamin D Lab Results  Component Value  Date   VD25OH 46 09/24/2013     Objective    BP 130/82   Pulse 67   Ht '5\' 8"'  (1.727 m)   Wt 172 lb (78 kg)   SpO2 96%   BMI 26.15 kg/m  BP Readings from Last 3 Encounters:  12/28/21 130/82  08/24/21 102/66  04/20/21 118/84   Wt Readings from Last 3 Encounters:  12/28/21 172 lb (78 kg)  08/24/21 170 lb (77.1 kg)  04/20/21 179 lb (81.2 kg)    Physical Exam  General:  Pleasant and cooperative, in no acute distress, appropriate for stated age.  Neuro:  Alert and oriented,  extra-ocular muscles intact  HEENT:  Normocephalic, atraumatic, neck supple  Skin:  no gross rash, warm, pink. Cardiac:  RRR, S1 S2 Respiratory: CTA B/L w/o wheezing, crackles or rales. Vascular:  Ext warm, no cyanosis apprec.; cap RF less 2 sec. Psych:  No HI/SI, judgement and insight good, Euthymic mood. Full Affect.   No results found for any visits on 12/28/21.  Assessment & Plan      Problem List Items Addressed This Visit       Cardiovascular and Mediastinum   HTN (hypertension) - Primary    -BP elevated on intake, repeated with improved. Will continue current medication regimen, see med list. Recommend obtaining CMP for medication monitoring with fasting labs at follow-up visit.        Other   Hyperlipidemia (Chronic)    -Last lipid panel: HDL 61, LDL 134  -Recommend to continue atorvastatin 20 mg daily and diet changes by following a low fat diet. Recommend collecting fasting lipid panel and hepatic function at follow-up visit and adjust treatment plan as needed.      Obsessive-compulsive disorder    -Stable. Continue Paxil 30 mg daily.       Relevant Medications   traZODone (DESYREL) 50 MG tablet   Insomnia    -Improved and stable. Continue Trazodone 50 mg and melatonin as needed. Good sleep hygiene discussed.      Relevant Medications   traZODone (DESYREL) 50 MG tablet    Return in about 4 months (around 04/29/2022) for CPE and FBW .        Lorrene Reid, PA-C  [facility] [phone] (phone) [phone] (fax)  [place]

## 2021-12-28 NOTE — Assessment & Plan Note (Signed)
-  Stable. Continue Paxil 30 mg daily.

## 2022-04-06 ENCOUNTER — Other Ambulatory Visit: Payer: Self-pay

## 2022-04-06 DIAGNOSIS — E785 Hyperlipidemia, unspecified: Secondary | ICD-10-CM

## 2022-04-06 DIAGNOSIS — F429 Obsessive-compulsive disorder, unspecified: Secondary | ICD-10-CM

## 2022-04-06 DIAGNOSIS — I10 Essential (primary) hypertension: Secondary | ICD-10-CM

## 2022-04-06 DIAGNOSIS — G4700 Insomnia, unspecified: Secondary | ICD-10-CM

## 2022-04-06 DIAGNOSIS — E789 Disorder of lipoprotein metabolism, unspecified: Secondary | ICD-10-CM

## 2022-04-06 MED ORDER — LISINOPRIL 10 MG PO TABS: 10.0000 mg | ORAL_TABLET | Freq: Every day | ORAL | 0 refills | Status: DC

## 2022-04-06 MED ORDER — TRAZODONE HCL 50 MG PO TABS: 25.0000 mg | ORAL_TABLET | Freq: Every evening | ORAL | 0 refills | Status: DC | PRN

## 2022-04-06 MED ORDER — ATORVASTATIN CALCIUM 20 MG PO TABS: 20.0000 mg | ORAL_TABLET | Freq: Every day | ORAL | 0 refills | Status: DC

## 2022-04-06 MED ORDER — PAROXETINE HCL 30 MG PO TABS: 30.0000 mg | ORAL_TABLET | Freq: Every day | ORAL | 0 refills | Status: DC

## 2022-05-02 ENCOUNTER — Encounter: Payer: Federal, State, Local not specified - PPO | Admitting: Nurse Practitioner

## 2022-05-03 ENCOUNTER — Other Ambulatory Visit: Payer: Self-pay | Admitting: Nurse Practitioner

## 2022-05-03 DIAGNOSIS — F429 Obsessive-compulsive disorder, unspecified: Secondary | ICD-10-CM

## 2022-05-03 DIAGNOSIS — E785 Hyperlipidemia, unspecified: Secondary | ICD-10-CM

## 2022-05-03 DIAGNOSIS — I10 Essential (primary) hypertension: Secondary | ICD-10-CM

## 2022-05-03 DIAGNOSIS — E789 Disorder of lipoprotein metabolism, unspecified: Secondary | ICD-10-CM

## 2022-05-04 ENCOUNTER — Other Ambulatory Visit: Payer: Self-pay | Admitting: Nurse Practitioner

## 2022-05-04 DIAGNOSIS — G4700 Insomnia, unspecified: Secondary | ICD-10-CM

## 2022-05-10 NOTE — Progress Notes (Signed)
Complete physical exam   Patient: Paul Carson   DOB: [birth year]   57 y.o. Male  MRN: [id number] Visit Date: 05/11/2022    Chief Complaint  Patient presents with   Annual Exam   Subjective    Paul Carson is a 57 y.o. male who presents today for a complete physical exam.  He reports consuming a  generally healthy  diet.  He generally feels well. He does not have additional problems to discuss today.   HPI  Annual physical  -hypertension  --well managed  -GAD and insomnia --taking paxil daily and trazodone when needed for insomnia  -due for routine, fasting labs - orders placed in EPIC  -He denies chest pain, chest pressure, or shortness of breath. He denies headaches or visual disturbances. He denies abdominal pain, nausea, vomiting, or changes in bowel or bladder habits.    Past Medical History:  Diagnosis Date   Allergy    seasonal   Anxiety    Heart murmur    Hyperlipidemia    Hypertension    OCD (obsessive compulsive disorder)    OSA (obstructive sleep apnea) 06/06/2017   No CPAP   Past Surgical History:  Procedure Laterality Date   CARPAL TUNNEL RELEASE  09/10/2010   left wrist   COLONOSCOPY  04/21/2017   Dr.Nandigam   POLYPECTOMY     VASECTOMY     Social History   Socioeconomic History   Marital status: Married    Spouse name: Paul Carson   Number of children: 2   Years of education: college   Highest education level: Bachelor's degree (e.g., BA, AB, BS)  Occupational History   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   Smoke detectors in home? Yes    Guns in home? No    Consistent seatbelt use? Yes    Consistent dental brushing? Yes Flossing? No   Semi-Annual dental visits: Yes    Annual Eye exams: Yes    Social Determinants of Health   Financial Resource Strain: Not on file  Food Insecurity: Not on file  Transportation Needs: Not on file  Physical Activity: Not on file  Stress: Not on file  Social Connections: Not on file  Intimate Partner Violence: Not on file   Family Status  Relation Name Status   Mother  Alive   Father  Alive   Brother  Alive   MGM  Deceased   MGF  Deceased   PGM  Deceased   PGF  Deceased   Daughter  Alive   Son  Alive   Neg Hx  (Not Specified)   Family History  Problem Relation Age of Onset   Healthy Mother    Hypertension Father    Healthy Brother    Healthy Daughter    Healthy Son    Colon cancer Neg Hx    Colon  polyps Neg Hx    Rectal cancer Neg Hx    Stomach cancer Neg Hx    Esophageal cancer Neg Hx    No Known Allergies  Patient Care Team: Lorrene Reid, PA-C (Inactive) as PCP - General (Physician Assistant)   Medications: Outpatient Medications Prior to Visit  Medication Sig   Melatonin 5 MG TABS Take by mouth at bedtime as needed.   traZODone (DESYREL) 50 MG tablet TAKE 0.5-1 TABLETS (25-50 MG TOTAL) BY MOUTH AT BEDTIME AS NEEDED. FOR SLEEP   [DISCONTINUED] atorvastatin (LIPITOR) 20 MG tablet TAKE 1 TABLET BY MOUTH EVERY DAY   [DISCONTINUED] lisinopril (ZESTRIL) 10 MG tablet TAKE 1 TABLET BY MOUTH EVERY DAY   [DISCONTINUED] PARoxetine (PAXIL) 30 MG tablet TAKE 1 TABLET BY MOUTH EVERY DAY   No facility-administered medications prior to visit.    Review of Systems  All other systems reviewed and are negative.      Objective     Today's Vitals   05/11/22 1014  BP: 130/88  Pulse: 60  SpO2: 96%  Weight: 177 lb 6.4 oz (80.5 kg)   Body mass index is 26.97  kg/m.  BP Readings from Last 3 Encounters:  05/11/22 130/88  12/28/21 130/82  08/24/21 102/66    Wt Readings from Last 3 Encounters:  05/11/22 177 lb 6.4 oz (80.5 kg)  12/28/21 172 lb (78 kg)  08/24/21 170 lb (77.1 kg)     Physical Exam Vitals and nursing note reviewed.  Constitutional:      Appearance: Normal appearance. He is well-developed.  HENT:     Head: Normocephalic and atraumatic.     Right Ear: Tympanic membrane, ear canal and external ear normal.     Left Ear: Tympanic membrane, ear canal and external ear normal.     Nose: Nose normal.     Mouth/Throat:     Mouth: Mucous membranes are moist.     Pharynx: Oropharynx is clear.  Eyes:     Extraocular Movements: Extraocular movements intact.     Conjunctiva/sclera: Conjunctivae normal.     Pupils: Pupils are equal, round, and reactive to light.  Neck:     Vascular: No carotid bruit.  Cardiovascular:     Rate and Rhythm: Normal rate and regular rhythm.     Pulses: Normal pulses.     Heart sounds: Normal heart sounds.  Pulmonary:     Effort: Pulmonary effort is normal.     Breath sounds: Normal breath sounds.  Abdominal:     General: Bowel sounds are normal. There is no distension.     Palpations: Abdomen is soft. There is no mass.     Tenderness: There is no abdominal tenderness. There is no right CVA tenderness, left CVA tenderness, guarding or rebound.     Hernia: No hernia is present.  Musculoskeletal:        General: Normal range of motion.     Cervical back: Normal range of motion and neck supple.  Lymphadenopathy:     Cervical: No cervical adenopathy.  Skin:    General: Skin is warm and dry.     Capillary Refill: Capillary refill takes less than 2 seconds.  Neurological:     General: No focal deficit present.     Mental Status: He is alert and oriented to person, place, and time.  Psychiatric:        Mood and Affect: Mood normal.        Behavior: Behavior normal.  Thought Content: Thought  content normal.        Judgment: Judgment normal.     Last depression screening scores   Row Labels 05/11/2022   10:18 AM 08/24/2021    9:55 AM 04/20/2021   10:11 AM  PHQ 2/9 Scores   Section Header. No data exists in this row.     PHQ - 2 Score   0 0 0  PHQ- 9 Score   0 0 2   Last fall risk screening   Row Labels 05/11/2022   10:19 AM  Fall Risk    Section Header. No data exists in this row.   Falls in the past year?   0  Number falls in past yr:   0  Injury with Fall?   0  Follow up   Falls evaluation completed    Results for orders placed or performed in visit on 05/11/22  Hemoglobin A1c  Result Value Ref Range   Hgb A1c MFr Bld 5.5 4.8 - 5.6 %   Est. average glucose Bld gHb Est-mCnc 111 mg/dL  CBC  Result Value Ref Range   WBC 5.1 3.4 - 10.8 x10E3/uL   RBC 5.51 4.14 - 5.80 x10E6/uL   Hemoglobin 16.3 13.0 - 17.7 g/dL   Hematocrit 47.2 37.5 - 51.0 %   MCV 86 79 - 97 fL   MCH 29.6 26.6 - 33.0 pg   MCHC 34.5 31.5 - 35.7 g/dL   RDW 12.0 11.6 - 15.4 %   Platelets 321 150 - 450 x10E3/uL  Comprehensive metabolic panel  Result Value Ref Range   Glucose 95 70 - 99 mg/dL   BUN 9 6 - 24 mg/dL   Creatinine, Ser 1.01 0.76 - 1.27 mg/dL   eGFR 88 >59 mL/min/1.73   BUN/Creatinine Ratio 9 9 - 20   Sodium 139 134 - 144 mmol/L   Potassium 4.3 3.5 - 5.2 mmol/L   Chloride 104 96 - 106 mmol/L   CO2 21 20 - 29 mmol/L   Calcium 9.1 8.7 - 10.2 mg/dL   Total Protein 6.3 6.0 - 8.5 g/dL   Albumin 4.2 3.8 - 4.9 g/dL   Globulin, Total 2.1 1.5 - 4.5 g/dL   Albumin/Globulin Ratio 2.0 1.2 - 2.2   Bilirubin Total 1.1 0.0 - 1.2 mg/dL   Alkaline Phosphatase 49 44 - 121 IU/L   AST 23 0 - 40 IU/L   ALT 20 0 - 44 IU/L  Lipid panel  Result Value Ref Range   Cholesterol, Total 187 100 - 199 mg/dL   Triglycerides 125 0 - 149 mg/dL   HDL 46 >39 mg/dL   VLDL Cholesterol Cal 22 5 - 40 mg/dL   LDL Chol Calc (NIH) 119 (H) 0 - 99 mg/dL   Chol/HDL Ratio 4.1 0.0 - 5.0 ratio  PSA  Result Value Ref  Range   Prostate Specific Ag, Serum 0.4 0.0 - 4.0 ng/mL  TSH + free T4  Result Value Ref Range   TSH 1.320 0.450 - 4.500 uIU/mL   Free T4 1.18 0.82 - 1.77 ng/dL    Assessment & Plan    1. Encounter for general adult medical examination with abnormal findings Annual physical today   2. Primary hypertension Stable. Continue bp medication as prescribed.  - TSH + free T4; Future - CBC; Future - CBC - TSH + free T4  3. Impaired fasting glucose Check HgbA1c with fasting albs  - Hemoglobin A1c; Future - Hemoglobin A1c  4. Hyperlipidemia, unspecified  hyperlipidemia type Routine, fasting labs drawn during today's visit.  - Lipid panel; Future - Comprehensive metabolic panel; Future - Hemoglobin A1c; Future - Hemoglobin A1c - Comprehensive metabolic panel - Lipid panel  5. Other fatigue Check thyroid panel and HgbA1c for further evaluation.  - TSH + free T4; Future - CBC; Future - CBC - TSH + free T4  6. Screening for prostate cancer PSA ordered with labs today  - PSA; Future - PSA    Immunization History  Administered Date(s) Administered   Influenza,inj,Quad PF,6+ Mos 03/25/2014, 04/13/2015, 03/29/2016, 01/03/2017, 03/14/2018, 03/23/2020, 04/20/2021   Tdap 04/11/2008, 03/14/2018   Zoster Recombinat (Shingrix) 03/23/2020, 07/29/2020    Health Maintenance  Topic Date Due   COVID-19 Vaccine (1) Never done   Hepatitis C Screening  Never done   INFLUENZA VACCINE  07/10/2022 (Originally 11/09/2021)   COLONOSCOPY (Pts 45-63yr Insurance coverage will need to be confirmed)  01/08/2026   DTaP/Tdap/Td (3 - Td or Tdap) 03/14/2028   HIV Screening  Completed   Zoster Vaccines- Shingrix  Completed   HPV VACCINES  Aged Out    Discussed health benefits of physical activity, and encouraged him to engage in regular exercise appropriate for his age and condition.  Problem List Items Addressed This Visit       Cardiovascular and Mediastinum   HTN (hypertension)   Relevant  Orders   TSH + free T4 (Completed)   CBC (Completed)     Endocrine   Impaired fasting glucose   Relevant Orders   Hemoglobin A1c (Completed)     Other   Hyperlipidemia (Chronic)   Relevant Orders   Lipid panel (Completed)   Comprehensive metabolic panel (Completed)   Hemoglobin A1c (Completed)   Other fatigue   Relevant Orders   TSH + free T4 (Completed)   CBC (Completed)   Other Visit Diagnoses     Encounter for general adult medical examination with abnormal findings    -  Primary   Screening for prostate cancer       Relevant Orders   PSA (Completed)        Return in about 4 months (around 09/09/2022) for blood pressure, mood.        HRonnell Freshwater NP  [phone]) [phone](fax)  [place]

## 2022-05-11 ENCOUNTER — Ambulatory Visit (INDEPENDENT_AMBULATORY_CARE_PROVIDER_SITE_OTHER): Payer: Federal, State, Local not specified - PPO | Admitting: Nurse Practitioner

## 2022-05-11 ENCOUNTER — Encounter: Payer: Self-pay | Admitting: Nurse Practitioner

## 2022-05-11 VITALS — BP 130/88 | HR 60 | Ht 68.0 in | Wt 177.4 lb

## 2022-05-11 DIAGNOSIS — I10 Essential (primary) hypertension: Secondary | ICD-10-CM | POA: Diagnosis not present

## 2022-05-11 DIAGNOSIS — Z0001 Encounter for general adult medical examination with abnormal findings: Secondary | ICD-10-CM | POA: Diagnosis not present

## 2022-05-11 DIAGNOSIS — R5383 Other fatigue: Secondary | ICD-10-CM | POA: Diagnosis not present

## 2022-05-11 DIAGNOSIS — E785 Hyperlipidemia, unspecified: Secondary | ICD-10-CM | POA: Diagnosis not present

## 2022-05-11 DIAGNOSIS — R7301 Impaired fasting glucose: Secondary | ICD-10-CM | POA: Diagnosis not present

## 2022-05-11 DIAGNOSIS — Z125 Encounter for screening for malignant neoplasm of prostate: Secondary | ICD-10-CM

## 2022-05-12 LAB — COMPREHENSIVE METABOLIC PANEL
ALT: 20 IU/L (ref 0–44)
AST: 23 IU/L (ref 0–40)
Albumin/Globulin Ratio: 2 (ref 1.2–2.2)
Albumin: 4.2 g/dL (ref 3.8–4.9)
Alkaline Phosphatase: 49 IU/L (ref 44–121)
BUN/Creatinine Ratio: 9 (ref 9–20)
BUN: 9 mg/dL (ref 6–24)
Bilirubin Total: 1.1 mg/dL (ref 0.0–1.2)
CO2: 21 mmol/L (ref 20–29)
Calcium: 9.1 mg/dL (ref 8.7–10.2)
Chloride: 104 mmol/L (ref 96–106)
Creatinine, Ser: 1.01 mg/dL (ref 0.76–1.27)
Globulin, Total: 2.1 g/dL (ref 1.5–4.5)
Glucose: 95 mg/dL (ref 70–99)
Potassium: 4.3 mmol/L (ref 3.5–5.2)
Sodium: 139 mmol/L (ref 134–144)
Total Protein: 6.3 g/dL (ref 6.0–8.5)
eGFR: 88 mL/min/{1.73_m2} (ref >59)

## 2022-05-12 LAB — CBC
Hematocrit: 47.2 % (ref 37.5–51.0)
Hemoglobin: 16.3 g/dL (ref 13.0–17.7)
MCH: 29.6 pg (ref 26.6–33.0)
MCHC: 34.5 g/dL (ref 31.5–35.7)
MCV: 86 fL (ref 79–97)
Platelets: 321 10*3/uL (ref 150–450)
RBC: 5.51 x10E6/uL (ref 4.14–5.80)
RDW: 12 % (ref 11.6–15.4)
WBC: 5.1 10*3/uL (ref 3.4–10.8)

## 2022-05-12 LAB — LIPID PANEL
Chol/HDL Ratio: 4.1 ratio (ref 0.0–5.0)
Cholesterol, Total: 187 mg/dL (ref 100–199)
HDL: 46 mg/dL (ref >39)
LDL Chol Calc (NIH): 119 mg/dL — ABNORMAL HIGH (ref 0–99)
Triglycerides: 125 mg/dL (ref 0–149)
VLDL Cholesterol Cal: 22 mg/dL (ref 5–40)

## 2022-05-12 LAB — PSA: Prostate Specific Ag, Serum: 0.4 ng/mL (ref 0.0–4.0)

## 2022-05-12 LAB — HEMOGLOBIN A1C
Est. average glucose Bld gHb Est-mCnc: 111 mg/dL
Hgb A1c MFr Bld: 5.5 % (ref 4.8–5.6)

## 2022-05-12 LAB — TSH+FREE T4
Free T4: 1.18 ng/dL (ref 0.82–1.77)
TSH: 1.32 u[IU]/mL (ref 0.450–4.500)

## 2022-05-31 ENCOUNTER — Other Ambulatory Visit: Payer: Self-pay | Admitting: Nurse Practitioner

## 2022-05-31 DIAGNOSIS — I10 Essential (primary) hypertension: Secondary | ICD-10-CM

## 2022-05-31 DIAGNOSIS — E785 Hyperlipidemia, unspecified: Secondary | ICD-10-CM

## 2022-05-31 DIAGNOSIS — E789 Disorder of lipoprotein metabolism, unspecified: Secondary | ICD-10-CM

## 2022-05-31 DIAGNOSIS — F429 Obsessive-compulsive disorder, unspecified: Secondary | ICD-10-CM

## 2022-06-05 ENCOUNTER — Other Ambulatory Visit: Payer: Self-pay | Admitting: Nurse Practitioner

## 2022-06-05 DIAGNOSIS — G4700 Insomnia, unspecified: Secondary | ICD-10-CM

## 2022-06-05 DIAGNOSIS — R5383 Other fatigue: Secondary | ICD-10-CM | POA: Insufficient documentation

## 2022-07-06 ENCOUNTER — Other Ambulatory Visit: Payer: Self-pay | Admitting: Family Medicine

## 2022-07-06 DIAGNOSIS — G4700 Insomnia, unspecified: Secondary | ICD-10-CM

## 2022-08-07 ENCOUNTER — Other Ambulatory Visit: Payer: Self-pay | Admitting: Family Medicine

## 2022-08-07 DIAGNOSIS — G4700 Insomnia, unspecified: Secondary | ICD-10-CM

## 2022-08-25 ENCOUNTER — Other Ambulatory Visit: Payer: Self-pay | Admitting: Nurse Practitioner

## 2022-08-25 DIAGNOSIS — E785 Hyperlipidemia, unspecified: Secondary | ICD-10-CM

## 2022-08-25 DIAGNOSIS — I10 Essential (primary) hypertension: Secondary | ICD-10-CM

## 2022-08-25 DIAGNOSIS — E789 Disorder of lipoprotein metabolism, unspecified: Secondary | ICD-10-CM

## 2022-08-25 DIAGNOSIS — F429 Obsessive-compulsive disorder, unspecified: Secondary | ICD-10-CM

## 2022-09-06 ENCOUNTER — Other Ambulatory Visit: Payer: Self-pay | Admitting: Family Medicine

## 2022-09-06 DIAGNOSIS — G4700 Insomnia, unspecified: Secondary | ICD-10-CM

## 2022-09-14 ENCOUNTER — Encounter: Payer: Self-pay | Admitting: Family Medicine

## 2022-09-14 ENCOUNTER — Ambulatory Visit: Payer: Federal, State, Local not specified - PPO | Admitting: Family Medicine

## 2022-09-14 VITALS — BP 130/90 | HR 60 | Resp 20 | Ht 68.0 in | Wt 175.0 lb

## 2022-09-14 DIAGNOSIS — I10 Essential (primary) hypertension: Secondary | ICD-10-CM

## 2022-09-14 DIAGNOSIS — E785 Hyperlipidemia, unspecified: Secondary | ICD-10-CM | POA: Diagnosis not present

## 2022-09-14 DIAGNOSIS — G4700 Insomnia, unspecified: Secondary | ICD-10-CM

## 2022-09-14 DIAGNOSIS — F429 Obsessive-compulsive disorder, unspecified: Secondary | ICD-10-CM | POA: Diagnosis not present

## 2022-09-14 DIAGNOSIS — Z1159 Encounter for screening for other viral diseases: Secondary | ICD-10-CM | POA: Diagnosis not present

## 2022-09-14 MED ORDER — LISINOPRIL 10 MG PO TABS: 10.0000 mg | ORAL_TABLET | Freq: Every day | ORAL | 1 refills | Status: DC

## 2022-09-14 MED ORDER — TRAZODONE HCL 50 MG PO TABS
ORAL_TABLET | ORAL | 1 refills | Status: DC
Start: 2022-09-14 — End: 2022-12-05

## 2022-09-14 MED ORDER — PAROXETINE HCL 30 MG PO TABS
30.0000 mg | ORAL_TABLET | Freq: Every day | ORAL | 1 refills | Status: DC
Start: 2022-09-14 — End: 2023-02-28

## 2022-09-14 MED ORDER — ATORVASTATIN CALCIUM 20 MG PO TABS: 20.0000 mg | ORAL_TABLET | Freq: Every day | ORAL | 1 refills | Status: DC

## 2022-09-14 NOTE — Assessment & Plan Note (Signed)
Last lipid panel: LDL 119, HDL 46, triglycerides 125.  Repeating lipid panel today.  Continue atorvastatin 20 mg daily unless lab results indicate necessary change.  Will continue to monitor.

## 2022-09-14 NOTE — Patient Instructions (Signed)
I will see you back in about 4 months for follow up, then in about 8 months from now for your annual physical. After that time, as long as everything remains stable, we can switch to appointments every 6 months. Keep up the great work!

## 2022-09-14 NOTE — Assessment & Plan Note (Signed)
Stable.  Continue Paxil 30 mg daily.  Will continue to monitor.

## 2022-09-14 NOTE — Assessment & Plan Note (Signed)
Stable.  Continue trazodone 50 mg and melatonin as needed.  Continue good sleep hygiene including going to bed earlier as he has been doing.  If he continues to need both medications every single night, we may consider switching to a few months of ramelteon to reset circadian rhythm.  Will continue to monitor.

## 2022-09-14 NOTE — Progress Notes (Signed)
Established Patient Office Visit  Subjective   Patient ID: Paul Carson, male    DOB: 02/17/1966  Age: 57 y.o. MRN: [id number]  Chief Complaint  Patient presents with   Anxiety   Depression   Hypertension    HPI Paul Carson is a 57 y.o. male presenting today for follow up of hypertension, OCD, insomnia. Hypertension: Patient here for follow-up of elevated blood pressure.   Pt denies chest pain, SOB, dizziness, edema, syncope, fatigue or heart palpitations. Taking lisinopril, reports excellent compliance with treatment. Denies side effects. Mood: Patient is here to follow up for OCD, currently managing with Paxil 30 mg daily. Taking medication without side effects, reports excellent compliance with treatment. Denies mood changes or SI/HI. He feels mood is stable since last visit.     09/14/2022    9:49 AM 05/11/2022   10:18 AM 08/24/2021    9:55 AM  Depression screen PHQ 2/9  Decreased Interest 0 0 0  Down, Depressed, Hopeless 0 0 0  PHQ - 2 Score 0 0 0  Altered sleeping 0 0 0  Tired, decreased energy 0 0 0  Change in appetite 0 0 0  Feeling bad or failure about yourself  0 0 0  Trouble concentrating 0 0 0  Moving slowly or fidgety/restless 0 0 0  Suicidal thoughts 0 0 0  PHQ-9 Score 0 0 0  Difficult doing work/chores Not difficult at all  Not difficult at all       09/14/2022    9:49 AM 05/11/2022   10:18 AM 08/24/2021    9:56 AM 04/20/2021   10:11 AM  GAD 7 : Generalized Anxiety Score  Nervous, Anxious, on Edge 0 0 0 0  Control/stop worrying 0 0 0 0  Worry too much - different things 0 0 0 0  Trouble relaxing 0 0 0 0  Restless 0 0 0 0  Easily annoyed or irritable 0 0 0 0  Afraid - awful might happen 0 0 0 0  Total GAD 7 Score 0 0 0 0  Anxiety Difficulty Not difficult at all  Not difficult at all Not difficult at all  Insomnia: He struggles with sleep induction. He is taking trazodone and melatonin almost nightly, denies side effects. Current sleep habits  include going to bed earlier and waking up earlier so that he does get sleepy at bedtime.   ROS Negative unless otherwise noted in HPI   Objective:     BP (!) 130/90 (BP Location: Left Arm, Patient Position: Sitting, Cuff Size: Normal)   Pulse 60   Resp 20   Ht 5\' 8"  (1.727 m)   Wt 175 lb (79.4 kg)   SpO2 95%   BMI 26.61 kg/m   Physical Exam Constitutional:      General: He is not in acute distress.    Appearance: Normal appearance.  HENT:     Head: Normocephalic and atraumatic.  Cardiovascular:     Rate and Rhythm: Regular rhythm.     Pulses: Normal pulses.     Heart sounds: Normal heart sounds. No murmur heard.    No friction rub. No gallop.  Pulmonary:     Effort: Pulmonary effort is normal.     Breath sounds: Normal breath sounds. No wheezing, rhonchi or rales.  Skin:    General: Skin is warm and dry.  Neurological:     Mental Status: He is alert and oriented to person, place, and time.  Psychiatric:  Mood and Affect: Mood normal.     Assessment & Plan:  Primary hypertension Assessment & Plan: BP goal <140/90.  Blood pressure essentially at goal today.  Continue lisinopril 10 mg daily.  Collecting CMP for medication monitoring.  Will continue to monitor.  Orders: -     CBC with Differential/Platelet; Future -     Comprehensive metabolic panel; Future -     Lisinopril; Take 1 tablet (10 mg total) by mouth daily.  Dispense: 90 tablet; Refill: 1  Obsessive-compulsive disorder, unspecified type Assessment & Plan: Stable.  Continue Paxil 30 mg daily.  Will continue to monitor.  Orders: -     CBC with Differential/Platelet; Future -     Comprehensive metabolic panel; Future -     PARoxetine HCl; Take 1 tablet (30 mg total) by mouth daily.  Dispense: 90 tablet; Refill: 1  Hyperlipidemia, unspecified hyperlipidemia type Assessment & Plan: Last lipid panel: LDL 119, HDL 46, triglycerides 125.  Repeating lipid panel today.  Continue atorvastatin 20 mg daily  unless lab results indicate necessary change.  Will continue to monitor.  Orders: -     Lipid panel; Future -     Atorvastatin Calcium; Take 1 tablet (20 mg total) by mouth daily.  Dispense: 90 tablet; Refill: 1  Insomnia, unspecified type Assessment & Plan: Stable.  Continue trazodone 50 mg and melatonin as needed.  Continue good sleep hygiene including going to bed earlier as he has been doing.  If he continues to need both medications every single night, we may consider switching to a few months of ramelteon to reset circadian rhythm.  Will continue to monitor.  Orders: -     traZODone HCl; TAKE 0.5-1 TABLET BY MOUTH AT BEDTIME AS NEEDED FOR SLEEP  Dispense: 60 tablet; Refill: 1  Screening for viral disease -     Hepatitis C antibody; Future    Return in about 4 months (around 01/14/2023) for follow-up for HTN, HLD, insomnia, OCD.    Melida Quitter, PA

## 2022-09-14 NOTE — Assessment & Plan Note (Signed)
BP goal <140/90.  Blood pressure essentially at goal today.  Continue lisinopril 10 mg daily.  Collecting CMP for medication monitoring.  Will continue to monitor.

## 2022-09-15 LAB — COMPREHENSIVE METABOLIC PANEL
ALT: 23 IU/L (ref 0–44)
AST: 20 IU/L (ref 0–40)
Albumin/Globulin Ratio: 2 (ref 1.2–2.2)
Albumin: 4.3 g/dL (ref 3.8–4.9)
Alkaline Phosphatase: 51 IU/L (ref 44–121)
BUN/Creatinine Ratio: 11 (ref 9–20)
BUN: 12 mg/dL (ref 6–24)
Bilirubin Total: 0.6 mg/dL (ref 0.0–1.2)
CO2: 23 mmol/L (ref 20–29)
Calcium: 9.4 mg/dL (ref 8.7–10.2)
Chloride: 107 mmol/L — ABNORMAL HIGH (ref 96–106)
Creatinine, Ser: 1.05 mg/dL (ref 0.76–1.27)
Globulin, Total: 2.2 g/dL (ref 1.5–4.5)
Glucose: 102 mg/dL — ABNORMAL HIGH (ref 70–99)
Potassium: 4.4 mmol/L (ref 3.5–5.2)
Sodium: 143 mmol/L (ref 134–144)
Total Protein: 6.5 g/dL (ref 6.0–8.5)
eGFR: 83 mL/min/{1.73_m2} (ref >59)

## 2022-09-15 LAB — CBC WITH DIFFERENTIAL/PLATELET
Basophils Absolute: 0 10*3/uL (ref 0.0–0.2)
Basos: 1 %
EOS (ABSOLUTE): 0.1 10*3/uL (ref 0.0–0.4)
Eos: 2 %
Hematocrit: 45.7 % (ref 37.5–51.0)
Hemoglobin: 15.7 g/dL (ref 13.0–17.7)
Immature Grans (Abs): 0 10*3/uL (ref 0.0–0.1)
Immature Granulocytes: 0 %
Lymphocytes Absolute: 1 10*3/uL (ref 0.7–3.1)
Lymphs: 19 %
MCH: 30 pg (ref 26.6–33.0)
MCHC: 34.4 g/dL (ref 31.5–35.7)
MCV: 87 fL (ref 79–97)
Monocytes Absolute: 0.5 10*3/uL (ref 0.1–0.9)
Monocytes: 9 %
Neutrophils Absolute: 3.5 10*3/uL (ref 1.4–7.0)
Neutrophils: 69 %
Platelets: 344 10*3/uL (ref 150–450)
RBC: 5.23 x10E6/uL (ref 4.14–5.80)
RDW: 12.9 % (ref 11.6–15.4)
WBC: 5.1 10*3/uL (ref 3.4–10.8)

## 2022-09-15 LAB — LIPID PANEL
Chol/HDL Ratio: 3.3 ratio (ref 0.0–5.0)
Cholesterol, Total: 159 mg/dL (ref 100–199)
HDL: 48 mg/dL (ref >39)
LDL Chol Calc (NIH): 93 mg/dL (ref 0–99)
Triglycerides: 99 mg/dL (ref 0–149)
VLDL Cholesterol Cal: 18 mg/dL (ref 5–40)

## 2022-09-15 LAB — HEPATITIS C ANTIBODY: Hep C Virus Ab: NONREACTIVE

## 2022-10-10 NOTE — Progress Notes (Signed)
Labs reviewed during subsequent visit.

## 2022-12-03 ENCOUNTER — Other Ambulatory Visit: Payer: Self-pay | Admitting: Family Medicine

## 2022-12-03 DIAGNOSIS — G4700 Insomnia, unspecified: Secondary | ICD-10-CM

## 2023-01-18 ENCOUNTER — Ambulatory Visit: Payer: Federal, State, Local not specified - PPO | Admitting: Family Medicine

## 2023-02-28 ENCOUNTER — Encounter: Payer: Self-pay | Admitting: Family Medicine

## 2023-02-28 ENCOUNTER — Ambulatory Visit: Payer: Federal, State, Local not specified - PPO | Admitting: Family Medicine

## 2023-02-28 VITALS — BP 140/95 | HR 65 | Resp 18 | Ht 68.0 in | Wt 178.0 lb

## 2023-02-28 DIAGNOSIS — E785 Hyperlipidemia, unspecified: Secondary | ICD-10-CM | POA: Diagnosis not present

## 2023-02-28 DIAGNOSIS — G4700 Insomnia, unspecified: Secondary | ICD-10-CM | POA: Diagnosis not present

## 2023-02-28 DIAGNOSIS — F429 Obsessive-compulsive disorder, unspecified: Secondary | ICD-10-CM | POA: Diagnosis not present

## 2023-02-28 DIAGNOSIS — Z23 Encounter for immunization: Secondary | ICD-10-CM

## 2023-02-28 DIAGNOSIS — I10 Essential (primary) hypertension: Secondary | ICD-10-CM

## 2023-02-28 MED ORDER — ATORVASTATIN CALCIUM 20 MG PO TABS
20.0000 mg | ORAL_TABLET | Freq: Every day | ORAL | 1 refills | Status: DC
Start: 2023-02-28 — End: 2023-12-04

## 2023-02-28 MED ORDER — VALSARTAN 80 MG PO TABS: 80.0000 mg | ORAL_TABLET | Freq: Every day | ORAL | 3 refills | Status: DC

## 2023-02-28 MED ORDER — TRAZODONE HCL 50 MG PO TABS: ORAL_TABLET | ORAL | 1 refills | Status: DC

## 2023-02-28 MED ORDER — PAROXETINE HCL 30 MG PO TABS: 30.0000 mg | ORAL_TABLET | Freq: Every day | ORAL | 1 refills | Status: DC

## 2023-02-28 NOTE — Assessment & Plan Note (Signed)
Stable.  Continue Paxil 30 mg daily.  Will continue to monitor.

## 2023-02-28 NOTE — Patient Instructions (Signed)
Stop taking lisinopril. Start taking valsartan instead. Check your blood pressure twice a day after taking your medication for 1-2 weeks and keep a log of the numbers to ensure that blood pressure is remaining at your goal of less than 140 on the top and less than 90 on the bottom.

## 2023-02-28 NOTE — Progress Notes (Signed)
Established Patient Office Visit  Subjective   Patient ID: Paul Carson, male    DOB: 1966/01/31  Age: 57 y.o. MRN: [id number]  Chief Complaint  Patient presents with   Hyperlipidemia   Hypertension   Insomnia    HPI Paul Carson is a 57 y.o. male presenting today for follow up of hypertension, hyperlipidemia, insomnia/OCD. Hypertension: Pt denies chest pain, SOB, dizziness, edema, syncope, fatigue or heart palpitations. Taking lisinopril, reports excellent compliance with treatment.  Endorses developing a tickle in the back of his throat and a dry cough that he believes may be associated with the lisinopril. Hyperlipidemia: tolerating atorvastatin well with no myalgias or significant side effects.  The 10-year ASCVD risk score (Arnett DK, et al., 2019) is: 6.7% Insomnia/OCD: He struggles with sleep induction. He is taking trazodone nightly, denies side effects.  OCD well-controlled with Paxil.   Outpatient Medications Prior to Visit  Medication Sig Note   Melatonin 5 MG TABS Take by mouth at bedtime as needed.    [DISCONTINUED] atorvastatin (LIPITOR) 20 MG tablet Take 1 tablet (20 mg total) by mouth daily.    [DISCONTINUED] lisinopril (ZESTRIL) 10 MG tablet Take 1 tablet (10 mg total) by mouth daily. 02/28/2023: dry cough   [DISCONTINUED] PARoxetine (PAXIL) 30 MG tablet Take 1 tablet (30 mg total) by mouth daily.    [DISCONTINUED] traZODone (DESYREL) 50 MG tablet TAKE 1/2-1 TABLET BY MOUTH AT BEDTIME AS NEEDED FOR SLEEP    No facility-administered medications prior to visit.    ROS Negative unless otherwise noted in HPI   Objective:     BP (!) 140/95 (BP Location: Left Arm, Patient Position: Sitting, Cuff Size: Normal)   Pulse 65   Resp 18   Ht 5\' 8"  (1.727 m)   Wt 178 lb (80.7 kg)   SpO2 96%   BMI 27.06 kg/m   Physical Exam Constitutional:      General: He is not in acute distress.    Appearance: Normal appearance.  HENT:     Head: Normocephalic and  atraumatic.  Cardiovascular:     Rate and Rhythm: Normal rate and regular rhythm.     Heart sounds: Normal heart sounds. No murmur heard.    No friction rub. No gallop.  Pulmonary:     Effort: Pulmonary effort is normal. No respiratory distress.     Breath sounds: Normal breath sounds. No wheezing, rhonchi or rales.  Skin:    General: Skin is warm and dry.  Neurological:     Mental Status: He is alert and oriented to person, place, and time.  Psychiatric:        Mood and Affect: Mood normal.     Assessment & Plan:  Primary hypertension Assessment & Plan: BP goal <140/90.  Due to dry cough caused by lisinopril, discontinue lisinopril and start valsartan 80 mg daily.  Keep blood pressure log for 1-2 weeks after starting to ensure good blood pressure control.  Will continue to monitor.  Orders: -     Valsartan; Take 1 tablet (80 mg total) by mouth daily.  Dispense: 90 tablet; Refill: 3  Hyperlipidemia, unspecified hyperlipidemia type Assessment & Plan: Last lipid panel: LDL 93, HDL 48, triglycerides 99.  Continue atorvastatin 20 mg daily.  Will continue to monitor.  Orders: -     Atorvastatin Calcium; Take 1 tablet (20 mg total) by mouth daily.  Dispense: 90 tablet; Refill: 1  Insomnia, unspecified type Assessment & Plan: Stable.  Continue trazodone 50 mg  and melatonin as needed.  Continue good sleep hygiene including going to bed earlier as he has been doing. Will continue to monitor.  Orders: -     traZODone HCl; TAKE 1/2-1 TABLET BY MOUTH AT BEDTIME AS NEEDED FOR SLEEP  Dispense: 60 tablet; Refill: 1  Obsessive-compulsive disorder, unspecified type Assessment & Plan: Stable.  Continue Paxil 30 mg daily.  Will continue to monitor.  Orders: -     PARoxetine HCl; Take 1 tablet (30 mg total) by mouth daily.  Dispense: 90 tablet; Refill: 1  Need for influenza vaccination -     Flu vaccine trivalent PF, 6mos and older(Flulaval,Afluria,Fluarix,Fluzone)    Return in about 6  months (around 08/28/2023) for annual physical, fasting blood work 1 week before.    Melida Quitter, PA

## 2023-02-28 NOTE — Assessment & Plan Note (Addendum)
BP goal <140/90.  Due to dry cough caused by lisinopril, discontinue lisinopril and start valsartan 80 mg daily.  Keep blood pressure log for 1-2 weeks after starting to ensure good blood pressure control.  Will continue to monitor.

## 2023-02-28 NOTE — Assessment & Plan Note (Signed)
Stable.  Continue trazodone 50 mg and melatonin as needed.  Continue good sleep hygiene including going to bed earlier as he has been doing. Will continue to monitor.

## 2023-02-28 NOTE — Assessment & Plan Note (Signed)
Last lipid panel: LDL 93, HDL 48, triglycerides 99.  Continue atorvastatin 20 mg daily.  Will continue to monitor.

## 2023-05-18 ENCOUNTER — Encounter: Payer: Self-pay | Admitting: Family Medicine

## 2023-08-22 ENCOUNTER — Other Ambulatory Visit: Payer: Federal, State, Local not specified - PPO

## 2023-08-28 ENCOUNTER — Encounter: Payer: Federal, State, Local not specified - PPO | Admitting: Family Medicine

## 2023-09-08 ENCOUNTER — Telehealth: Payer: Self-pay

## 2023-09-08 NOTE — Telephone Encounter (Signed)
 LVM to return call to get scheduled for physical and labs with Meryl Acosta, PA

## 2023-09-13 ENCOUNTER — Other Ambulatory Visit: Payer: Self-pay | Admitting: Family Medicine

## 2023-09-13 DIAGNOSIS — G4700 Insomnia, unspecified: Secondary | ICD-10-CM

## 2023-09-13 NOTE — Telephone Encounter (Signed)
 Lvm for pt to call office to reschedule physical and labs with new provider, once this is scheduled we can then send medication refill in to last until appt.

## 2023-09-14 NOTE — Telephone Encounter (Signed)
 Sending in 30 days with 0 refills is appropriate until patient can schedule a follow up. Rx request approved. Thanks!

## 2023-11-27 ENCOUNTER — Other Ambulatory Visit: Payer: Self-pay | Admitting: *Deleted

## 2023-11-27 DIAGNOSIS — E785 Hyperlipidemia, unspecified: Secondary | ICD-10-CM

## 2023-11-27 DIAGNOSIS — I10 Essential (primary) hypertension: Secondary | ICD-10-CM

## 2023-11-27 DIAGNOSIS — Z131 Encounter for screening for diabetes mellitus: Secondary | ICD-10-CM

## 2023-11-28 ENCOUNTER — Other Ambulatory Visit

## 2023-11-28 DIAGNOSIS — Z131 Encounter for screening for diabetes mellitus: Secondary | ICD-10-CM

## 2023-11-28 DIAGNOSIS — I10 Essential (primary) hypertension: Secondary | ICD-10-CM

## 2023-11-28 DIAGNOSIS — E785 Hyperlipidemia, unspecified: Secondary | ICD-10-CM

## 2023-11-29 ENCOUNTER — Ambulatory Visit: Payer: Self-pay | Admitting: Family Medicine

## 2023-11-29 LAB — COMPREHENSIVE METABOLIC PANEL WITH GFR
ALT: 26 IU/L (ref 0–44)
AST: 24 IU/L (ref 0–40)
Albumin: 4.2 g/dL (ref 3.8–4.9)
Alkaline Phosphatase: 52 IU/L (ref 44–121)
BUN/Creatinine Ratio: 12 (ref 9–20)
BUN: 13 mg/dL (ref 6–24)
Bilirubin Total: 0.7 mg/dL (ref 0.0–1.2)
CO2: 22 mmol/L (ref 20–29)
Calcium: 9.1 mg/dL (ref 8.7–10.2)
Chloride: 104 mmol/L (ref 96–106)
Creatinine, Ser: 1.09 mg/dL (ref 0.76–1.27)
Globulin, Total: 2.2 g/dL (ref 1.5–4.5)
Glucose: 84 mg/dL (ref 70–99)
Potassium: 4.4 mmol/L (ref 3.5–5.2)
Sodium: 141 mmol/L (ref 134–144)
Total Protein: 6.4 g/dL (ref 6.0–8.5)
eGFR: 79 mL/min/1.73 (ref >59)

## 2023-11-29 LAB — CBC WITH DIFFERENTIAL/PLATELET
Basophils Absolute: 0 x10E3/uL (ref 0.0–0.2)
Basos: 1 %
EOS (ABSOLUTE): 0.2 x10E3/uL (ref 0.0–0.4)
Eos: 3 %
Hematocrit: 43.5 % (ref 37.5–51.0)
Hemoglobin: 14.5 g/dL (ref 13.0–17.7)
Immature Grans (Abs): 0 x10E3/uL (ref 0.0–0.1)
Immature Granulocytes: 0 %
Lymphocytes Absolute: 1.6 x10E3/uL (ref 0.7–3.1)
Lymphs: 25 %
MCH: 29.9 pg (ref 26.6–33.0)
MCHC: 33.3 g/dL (ref 31.5–35.7)
MCV: 90 fL (ref 79–97)
Monocytes Absolute: 0.8 x10E3/uL (ref 0.1–0.9)
Monocytes: 12 %
Neutrophils Absolute: 3.8 x10E3/uL (ref 1.4–7.0)
Neutrophils: 58 %
Platelets: 341 x10E3/uL (ref 150–450)
RBC: 4.85 x10E6/uL (ref 4.14–5.80)
RDW: 13.2 % (ref 11.6–15.4)
WBC: 6.4 x10E3/uL (ref 3.4–10.8)

## 2023-11-29 LAB — LIPID PANEL
Chol/HDL Ratio: 4.3 ratio (ref 0.0–5.0)
Cholesterol, Total: 196 mg/dL (ref 100–199)
HDL: 46 mg/dL (ref >39)
LDL Chol Calc (NIH): 121 mg/dL — ABNORMAL HIGH (ref 0–99)
Triglycerides: 163 mg/dL — ABNORMAL HIGH (ref 0–149)
VLDL Cholesterol Cal: 29 mg/dL (ref 5–40)

## 2023-11-29 LAB — HEMOGLOBIN A1C
Est. average glucose Bld gHb Est-mCnc: 114 mg/dL
Hgb A1c MFr Bld: 5.6 % (ref 4.8–5.6)

## 2023-11-29 LAB — TSH: TSH: 2.44 u[IU]/mL (ref 0.450–4.500)

## 2023-12-04 ENCOUNTER — Ambulatory Visit (INDEPENDENT_AMBULATORY_CARE_PROVIDER_SITE_OTHER): Admitting: Family Medicine

## 2023-12-04 ENCOUNTER — Encounter: Payer: Self-pay | Admitting: Family Medicine

## 2023-12-04 VITALS — BP 136/88 | HR 83 | Ht 68.0 in | Wt 178.0 lb

## 2023-12-04 DIAGNOSIS — L578 Other skin changes due to chronic exposure to nonionizing radiation: Secondary | ICD-10-CM

## 2023-12-04 DIAGNOSIS — G4700 Insomnia, unspecified: Secondary | ICD-10-CM | POA: Diagnosis not present

## 2023-12-04 DIAGNOSIS — M7712 Lateral epicondylitis, left elbow: Secondary | ICD-10-CM

## 2023-12-04 DIAGNOSIS — E785 Hyperlipidemia, unspecified: Secondary | ICD-10-CM | POA: Diagnosis not present

## 2023-12-04 DIAGNOSIS — Z0000 Encounter for general adult medical examination without abnormal findings: Secondary | ICD-10-CM | POA: Diagnosis not present

## 2023-12-04 MED ORDER — ATORVASTATIN CALCIUM 20 MG PO TABS: 20.0000 mg | ORAL_TABLET | Freq: Every day | ORAL | 1 refills | Status: DC

## 2023-12-04 MED ORDER — TRAZODONE HCL 50 MG PO TABS: 25.0000 mg | ORAL_TABLET | Freq: Every evening | ORAL | 2 refills | Status: DC | PRN

## 2023-12-04 NOTE — Patient Instructions (Addendum)
 It was nice to see you today,  We addressed the following topics today: -I am increasing your atorvastatin .  In 8 weeks we can recheck your cholesterol level.  Ideally your LDL would be under 100. - If you do not tolerate the increase in your medication let me know and we can go back to the previous dose and add a different medication if needed - The spot on your cheek is this way make the left to check let us  know what they want to contact you and I may yes yeah called a solar lentigo.  These are benign lesions related to sun exposure - I have provided you with some exercises you can do at home for your elbow pain.  Have a great day,  Rolan Slain, MD

## 2023-12-04 NOTE — Progress Notes (Unsigned)
 Annual physical  Subjective   Patient ID: Paul Carson, male    DOB: 03-28-1966  Age: 58 y.o. MRN: [id number]  No chief complaint on file.  HPI Paul Carson is a 58 y.o. old male here  for annual exam.   Subjective - Here for physical. Main concern is bilateral elbow pain, worse at night and upon waking. Describes it as feeling overextended. Has had this for ~10 years. Reports some relief from prior cortisone injections. No tingling or numbness reported. Denies significant pain during the day. - Reports lifelong difficulty sleeping. Mind races. - Has a recurring lesion on his ear that irritates him while sleeping. Describes it as a small bump that he can eventually pick off after about a week. Concerned about skin cancer due to sun exposure.  Medications: Valsartan  for blood pressure, Trazodone  nightly for sleep, Melatonin 5-10 mg for sleep, Paxil  for 35 years, and Atorvastatin  20 mg for cholesterol. Reports good compliance with medications.  PMH: Hypertension. Hyperlipidemia. Carpal tunnel syndrome, s/p surgery on one hand. History of anxiety/depression (inferred from long-term Paxil  use). PSH: Carpal tunnel surgery. FH: Not discussed. Social Hx: Recently retired [employer] account planner. Married. Denies alcohol use. Quit tobacco 15-20 years ago. No regular exercise routine but stays active with yard work and working on cars. Diet is generally regular but trying to reduce Peps[employer] consumption.  ROS: Constitutional: No fevers, chills. CV: Denies chest pain. Musculoskeletal: Reports joint pain in elbows. Denies muscle aches. Skin: Reports a recurring lesion on the ear. Psychiatric: Reports racing thoughts, difficulty sleeping.  Objective PULM: Lungs clear to auscultation bilaterally. MSK: Full strength with arm flexion/extension. Tenderness to palpation over the left lateral epicondyle. No pain with resisted extension of the wrist. No pain with palpation of the medial epicondyle.  Assessment and  Plan 1. 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 Pain is worse at night. On exam, there is point tenderness over the left lateral epicondyle. - Counseled on home exercises with bands or light weights to be performed daily. - Advised that improvement may take several weeks. - Recommended follow-up with Orthopedics if symptoms worsen or fail to improve.  2. Hyperlipidemia Labs show a slight increase in cholesterol levels despite compliance with atorvastatin  20 mg. LDL has consistently been >899. Denies myalgias. - Increase Atorvastatin  to 40 mg daily. - Order lipid panel to be checked in 2 months. - Counseled on the goal of LDL <100 to reduce risk of heart attack and stroke.  3. Health Maintenance Due for routine follow-up and preventive care. Up to date on colonoscopy. Recent labs (thyroid , A1C, kidney/liver function, CBC) were normal. - Administered Pneumococcal vaccine today. - Schedule follow-up appointment in 6 months.  4. Insomnia Longstanding history of difficulty sleeping with racing thoughts. Takes trazodone  and melatonin with some benefit. - Refill Trazodone  as requested. - Continue current management.  5. Skin lesion, ear Reports a recurring, irritating lesion on the ear that eventually falls off. Also notes sun-exposed skin changes. - Counseled on signs of skin cancer to monitor for (new or changing bumps/moles). - Discussed that the ear lesion is unlikely to be cancerous but cannot be certain without evaluation. - Offered referral to Dermatology for evaluation of skin lesions, which was deferred at this time.   The 10-year ASCVD risk score (Paul Carson, et al., 2019) is: 9.5%  Health Maintenance Due  Topic Date Due   Hepatitis B  Vaccines 19-59 Average Risk (1 of 3 - 19+ 3-dose series) Never done   Pneumococcal Vaccine: 50+ Years (1 of 1 - PCV)  Never done   COVID-19 Vaccine (1 - 2024-25 season) Never done   INFLUENZA VACCINE  11/10/2023      Objective:     There were no vitals taken for this visit. {Vitals History (Optional):23777}  Physical Exam   No results found for any visits on 12/04/23.      Assessment & Plan:   There are no diagnoses linked to this encounter.   No follow-ups on file.    [name], MD

## 2023-12-05 ENCOUNTER — Encounter: Payer: Self-pay | Admitting: Family Medicine

## 2023-12-05 ENCOUNTER — Other Ambulatory Visit: Payer: Self-pay | Admitting: Family Medicine

## 2023-12-05 DIAGNOSIS — E785 Hyperlipidemia, unspecified: Secondary | ICD-10-CM

## 2023-12-05 DIAGNOSIS — M7710 Lateral epicondylitis, unspecified elbow: Secondary | ICD-10-CM | POA: Insufficient documentation

## 2023-12-05 DIAGNOSIS — L578 Other skin changes due to chronic exposure to nonionizing radiation: Secondary | ICD-10-CM | POA: Insufficient documentation

## 2023-12-05 MED ORDER — ATORVASTATIN CALCIUM 40 MG PO TABS
40.0000 mg | ORAL_TABLET | Freq: Every day | ORAL | 1 refills | Status: AC
Start: 2023-12-05 — End: ?

## 2023-12-05 NOTE — Assessment & Plan Note (Signed)
 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 Pain is worse at night. On exam, there is point tenderness over the left lateral epicondyle. - Counseled on home exercises with bands or light weights to be performed daily. - Advised that improvement may take several weeks. - Recommended follow-up with Orthopedics if symptoms worsen or fail to improve.

## 2023-12-05 NOTE — Assessment & Plan Note (Signed)
-   solar lentigo on right cheek - Counseled on signs of skin cancer to monitor for (new or changing bumps/moles). - Discussed that the ear lesion is unlikely to be cancerous but cannot be certain without evaluation. - Offered referral to Dermatology for evaluation of skin lesions, which was deferred at this time.

## 2023-12-05 NOTE — Assessment & Plan Note (Signed)
 Labs show a slight increase in cholesterol levels despite compliance with atorvastatin  20 mg. LDL has consistently been >899. Denies myalgias. - Increase Atorvastatin  to 40 mg daily. - Order lipid panel to be checked in 2 months. - Counseled on the goal of LDL <100 to reduce risk of heart attack and stroke.

## 2023-12-05 NOTE — Assessment & Plan Note (Signed)
 Due for routine follow-up and preventive care. Up to date on colonoscopy. Recent labs (thyroid , A1C, kidney/liver function, CBC) were normal. - Administered Pneumococcal vaccine today. - Schedule follow-up appointment in 6 months.

## 2023-12-05 NOTE — Assessment & Plan Note (Signed)
 Longstanding history of difficulty sleeping with racing thoughts. Takes trazodone  and melatonin with some benefit. - Refill Trazodone  as requested. - Continue current management.

## 2024-01-28 NOTE — Addendum Note (Signed)
 Addended by: CHANDRA TORIBIO POUR on: 01/28/2024 09:43 PM   Modules accepted: Orders

## 2024-01-29 ENCOUNTER — Other Ambulatory Visit

## 2024-01-29 DIAGNOSIS — E785 Hyperlipidemia, unspecified: Secondary | ICD-10-CM | POA: Diagnosis not present

## 2024-01-30 LAB — LIPID PANEL
Chol/HDL Ratio: 3.4 ratio (ref 0.0–5.0)
Cholesterol, Total: 161 mg/dL (ref 100–199)
HDL: 47 mg/dL (ref >39)
LDL Chol Calc (NIH): 95 mg/dL (ref 0–99)
Triglycerides: 105 mg/dL (ref 0–149)
VLDL Cholesterol Cal: 19 mg/dL (ref 5–40)

## 2024-01-30 LAB — COMPREHENSIVE METABOLIC PANEL WITH GFR
ALT: 18 IU/L (ref 0–44)
AST: 20 IU/L (ref 0–40)
Albumin: 4.2 g/dL (ref 3.8–4.9)
Alkaline Phosphatase: 61 IU/L (ref 47–123)
BUN/Creatinine Ratio: 9 (ref 9–20)
BUN: 10 mg/dL (ref 6–24)
Bilirubin Total: 0.8 mg/dL (ref 0.0–1.2)
CO2: 21 mmol/L (ref 20–29)
Calcium: 9.1 mg/dL (ref 8.7–10.2)
Chloride: 105 mmol/L (ref 96–106)
Creatinine, Ser: 1.16 mg/dL (ref 0.76–1.27)
Globulin, Total: 2.3 g/dL (ref 1.5–4.5)
Glucose: 89 mg/dL (ref 70–99)
Potassium: 4.5 mmol/L (ref 3.5–5.2)
Sodium: 141 mmol/L (ref 134–144)
Total Protein: 6.5 g/dL (ref 6.0–8.5)
eGFR: 73 mL/min/1.73 (ref >59)

## 2024-01-31 ENCOUNTER — Ambulatory Visit: Payer: Self-pay | Admitting: Family Medicine

## 2024-03-07 ENCOUNTER — Other Ambulatory Visit: Payer: Self-pay | Admitting: Family Medicine

## 2024-03-07 DIAGNOSIS — F429 Obsessive-compulsive disorder, unspecified: Secondary | ICD-10-CM

## 2024-03-07 DIAGNOSIS — I10 Essential (primary) hypertension: Secondary | ICD-10-CM

## 2024-04-10 ENCOUNTER — Other Ambulatory Visit: Payer: Self-pay | Admitting: Family Medicine

## 2024-04-10 DIAGNOSIS — G4700 Insomnia, unspecified: Secondary | ICD-10-CM

## 2024-06-05 ENCOUNTER — Ambulatory Visit: Admitting: Family Medicine
